# Patient Record
Sex: Female | Born: 2000 | Race: Black or African American | Hispanic: No | Marital: Single | Smoking: Current every day smoker
Health system: Southern US, Community
[De-identification: ages and names within clinical notes are randomized; demographics above are authoritative.]

## PROBLEM LIST (undated history)

## (undated) DIAGNOSIS — B9689 Other specified bacterial agents as the cause of diseases classified elsewhere: Secondary | ICD-10-CM

## (undated) DIAGNOSIS — A749 Chlamydial infection, unspecified: Secondary | ICD-10-CM

## (undated) DIAGNOSIS — R519 Headache, unspecified: Secondary | ICD-10-CM

## (undated) DIAGNOSIS — F419 Anxiety disorder, unspecified: Secondary | ICD-10-CM

## (undated) DIAGNOSIS — N760 Acute vaginitis: Secondary | ICD-10-CM

## (undated) DIAGNOSIS — R51 Headache: Secondary | ICD-10-CM

## (undated) DIAGNOSIS — N390 Urinary tract infection, site not specified: Secondary | ICD-10-CM

## (undated) HISTORY — DX: Urinary tract infection, site not specified: N39.0

## (undated) HISTORY — DX: Other specified bacterial agents as the cause of diseases classified elsewhere: N76.0

## (undated) HISTORY — PX: WISDOM TOOTH EXTRACTION: SHX21

## (undated) HISTORY — DX: Headache: R51

## (undated) HISTORY — DX: Acute vaginitis: B96.89

## (undated) HISTORY — DX: Headache, unspecified: R51.9

## (undated) HISTORY — DX: Anxiety disorder, unspecified: F41.9

## (undated) HISTORY — DX: Chlamydial infection, unspecified: A74.9

## (undated) HISTORY — PX: NO PAST SURGERIES: SHX2092

---
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
## 2018-01-16 ENCOUNTER — Ambulatory Visit (INDEPENDENT_AMBULATORY_CARE_PROVIDER_SITE_OTHER): Payer: 59 | Admitting: Obstetrics and Gynecology

## 2018-01-16 ENCOUNTER — Encounter: Payer: Self-pay | Admitting: Obstetrics and Gynecology

## 2018-01-16 ENCOUNTER — Other Ambulatory Visit (HOSPITAL_COMMUNITY)
Admission: RE | Admit: 2018-01-16 | Discharge: 2018-01-16 | Disposition: A | Discharge status: Home / Self Care | Payer: 59 | Source: Ambulatory Visit | Attending: Obstetrics and Gynecology | Admitting: Obstetrics and Gynecology

## 2018-01-16 VITALS — BP 98/60 | HR 84 | Ht 60.0 in | Wt 104.0 lb

## 2018-01-16 DIAGNOSIS — Z113 Encounter for screening for infections with a predominantly sexual mode of transmission: Secondary | ICD-10-CM

## 2018-01-16 DIAGNOSIS — N760 Acute vaginitis: Secondary | ICD-10-CM | POA: Diagnosis not present

## 2018-01-16 DIAGNOSIS — N921 Excessive and frequent menstruation with irregular cycle: Secondary | ICD-10-CM

## 2018-01-16 LAB — POCT WET PREP WITH KOH
Clue Cells Wet Prep HPF POC: NEGATIVE
KOH PREP POC: NEGATIVE
Trichomonas, UA: NEGATIVE
Yeast Wet Prep HPF POC: NEGATIVE

## 2018-01-16 MED ORDER — CLOTRIMAZOLE-BETAMETHASONE 1-0.05 % EX CREA: TOPICAL_CREAM | CUTANEOUS | 0 refills | Status: DC

## 2018-01-16 NOTE — Progress Notes (Signed)
[name], NP   Chief Complaint  Patient presents with  . STD check    pts feel outside of vaginal area irritated.    HPI:      [name] is a 17 y.o. G0P0000 who LMP was No LMP recorded. Patient has had an injection., presents today for ext vaginal irritation without increased d/c or odor for several days. Had yeast vag about a month ago and treated with diflucan with sx relief. No recent abx use. Pt using dove sens skin soap and dryer sheets. Would also like STD testing due to new sex partner. Using condoms sometimes. No known exposures, just wants to be safe. Neg STD testing 2/19 and 7/19. Hx of chlamydia in past.  Changed from depo to OCPs at 7/19 annual. Pt having BTB with pills, has missed a pill.   Past Medical History:  Diagnosis Date  . Chlamydia 2017   x2  . Frequent headaches   . UTI (lower urinary tract infection)     Past Surgical History:  Procedure Laterality Date  . NO PAST SURGERIES      Family History  Problem Relation Age of Onset  . Arthritis Maternal Grandmother   . Stroke Maternal Grandmother   . Hypertension Maternal Grandmother   . Diabetes Maternal Grandmother   . Hypercholesterolemia Maternal Grandmother   . Hypertension Maternal Grandfather   . Hypercholesterolemia Maternal Grandfather   . Other Mother        acid reflux    Social History   Socioeconomic History  . Marital status: Single    Spouse name: Not on file  . Number of children: 0  . Years of education: 37  . Highest education level: Not on file  Occupational History  . 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 Occupation: Consulting civil engineer    Comment: [employer]  Social Needs  . Financial resource strain: Not on file  . Food insecurity:    Worry: Not on file    Inability: Not on file  . Transportation needs:    Medical: Not on file    Non-medical: Not on file  Tobacco Use  . Smoking status: Never Smoker  . Smokeless tobacco: Never  Used  Substance and Sexual Activity  . Alcohol use: No    Alcohol/week: 0.0 standard drinks  . Drug use: No  . Sexual activity: Yes    Partners: Male    Birth control/protection: Pill  Lifestyle  . Physical activity:    Days per week: Not on file    Minutes per session: Not on file  . Stress: Not on file  Relationships  . Social connections:    Talks on phone: Not on file    Gets together: Not on file    Attends religious service: Not on file    Active member of club or organization: Not on file    Attends meetings of clubs or organizations: Not on file    Relationship status: Not on file  . Intimate partner violence:    Fear of current or ex partner: Not on file    Emotionally abused: Not on file    Physically abused: Not on file    Forced sexual activity: Not on file  Other Topics Concern  . Not on file  Social History Narrative   Student at [employer]   She would like to a metrologists.    Enjoys spending time with friends.   Will  be starting a job with [employer] in August 2019    Outpatient Medications Prior to Visit  Medication Sig Dispense Refill  . fluconazole (DIFLUCAN) 150 MG tablet Take one tablet today (Patient not taking: Reported on 01/16/2018) 1 tablet 0  . nystatin ointment (MYCOSTATIN) Apply 1 application topically 2 (two) times daily. 30 g 0   No facility-administered medications prior to visit.       ROS:  Review of Systems  Constitutional: Negative for fatigue, fever and unexpected weight change.  Respiratory: Negative for cough, shortness of breath and wheezing.   Cardiovascular: Negative for chest pain, palpitations and leg swelling.  Gastrointestinal: Negative for blood in stool, constipation, diarrhea, nausea and vomiting.  Endocrine: Negative for cold intolerance, heat intolerance and polyuria.  Genitourinary: Positive for menstrual problem. Negative for dyspareunia, dysuria, flank pain, frequency, genital sores, hematuria, pelvic pain, urgency, vaginal  bleeding, vaginal discharge and vaginal pain.  Musculoskeletal: Negative for back pain, joint swelling and myalgias.  Skin: Negative for rash.  Neurological: Negative for dizziness, syncope, light-headedness, numbness and headaches.  Hematological: Negative for adenopathy.  Psychiatric/Behavioral: Negative for agitation, confusion, sleep disturbance and suicidal ideas. The patient is not nervous/anxious.     OBJECTIVE:   Vitals:  BP 98/60   Pulse 84   Ht 5' (1.524 m)   Wt 104 lb (47.2 kg)   BMI 20.31 kg/m   Physical Exam  Constitutional: She is oriented to person, place, and time. Vital signs are normal. She appears well-developed.  Pulmonary/Chest: Effort normal.  Genitourinary: Vagina normal and uterus normal. There is no rash, tenderness or lesion on the right labia. There is no rash, tenderness or lesion on the left labia. Uterus is not enlarged and not tender. Cervix exhibits no motion tenderness. Right adnexum displays no mass and no tenderness. Left adnexum displays no mass and no tenderness. No erythema or tenderness in the vagina. No vaginal discharge found.  Musculoskeletal: Normal range of motion.  Neurological: She is alert and oriented to person, place, and time.  Psychiatric: She has a normal mood and affect. Her behavior is normal. Thought content normal.  Vitals reviewed.   Results: Results for orders placed or performed in visit on 01/16/18 (from the past 24 hour(s))  POCT Wet Prep with KOH     Status: Normal   Collection Time: 01/16/18  3:23 PM  Result Value Ref Range   Trichomonas, UA Negative    Clue Cells Wet Prep HPF POC neg    Epithelial Wet Prep HPF POC     Yeast Wet Prep HPF POC neg    Bacteria Wet Prep HPF POC     RBC Wet Prep HPF POC     WBC Wet Prep HPF POC     KOH Prep POC Negative Negative     Assessment/Plan: Acute vaginitis - Minimal exam/neg wet prep. Question chem vs fungal. Line dry underwear/no dryer sheets. Rx lotrisone crm. F/u prn.   - Plan: POCT Wet Prep with KOH, clotrimazole-betamethasone (LOTRISONE) cream  Screening for STD (sexually transmitted disease) - Will call with results.  - Plan: Cervicovaginal ancillary only  Breakthrough bleeding on OCPs - Give OCPs more time. Don't miss/be late with pills. F/u prn.    Meds ordered this encounter  Medications  . clotrimazole-betamethasone (LOTRISONE) cream    Sig: Apply externally BID prn sx up to 2 wks    Dispense:  15 g    Refill:  0    Order Specific Question:   Supervising Provider  Answer:   Nadara Mustard [[id number]]      Return if symptoms worsen or fail to improve.  Alicia B. Copland, PA-C 01/16/2018 3:25 PM

## 2018-01-16 NOTE — Patient Instructions (Signed)
I value your feedback and entrusting us with your care. If you get a [facility] patient survey, I would appreciate you taking the time to let us know about your experience today. Thank you! 

## 2018-01-18 LAB — CERVICOVAGINAL ANCILLARY ONLY
CHLAMYDIA, DNA PROBE: POSITIVE — AB
Neisseria Gonorrhea: NEGATIVE
TRICH (WINDOWPATH): NEGATIVE

## 2018-01-21 ENCOUNTER — Telehealth: Payer: Self-pay | Admitting: Obstetrics and Gynecology

## 2018-01-21 DIAGNOSIS — A749 Chlamydial infection, unspecified: Secondary | ICD-10-CM

## 2018-01-21 MED ORDER — AZITHROMYCIN 500 MG PO TABS
1000.0000 mg | ORAL_TABLET | Freq: Once | ORAL | 0 refills | Status: AC
Start: 2018-01-21 — End: 2018-01-21

## 2018-01-21 NOTE — Telephone Encounter (Signed)
LM with chlamydia results. Rx azithro. Partner needs tx. RTO in 4 wks for TOC. RN to notify [facility].

## 2018-01-22 NOTE — Telephone Encounter (Signed)
Done

## 2018-02-07 ENCOUNTER — Other Ambulatory Visit: Payer: Self-pay | Admitting: Certified Nurse Midwife

## 2018-02-07 MED ORDER — NORETHIN ACE-ETH ESTRAD-FE 1-20 MG-MCG(24) PO CAPS: 1.0000 | ORAL_CAPSULE | Freq: Every day | ORAL | 2 refills | Status: DC

## 2018-04-22 ENCOUNTER — Encounter: Payer: Self-pay | Admitting: Certified Nurse Midwife

## 2018-04-22 ENCOUNTER — Ambulatory Visit (INDEPENDENT_AMBULATORY_CARE_PROVIDER_SITE_OTHER): Payer: Managed Care, Other (non HMO) | Admitting: Certified Nurse Midwife

## 2018-04-22 ENCOUNTER — Other Ambulatory Visit (HOSPITAL_COMMUNITY)
Admission: RE | Admit: 2018-04-22 | Discharge: 2018-04-22 | Disposition: A | Discharge status: Home / Self Care | Payer: Managed Care, Other (non HMO) | Source: Ambulatory Visit | Attending: Certified Nurse Midwife | Admitting: Certified Nurse Midwife

## 2018-04-22 ENCOUNTER — Other Ambulatory Visit: Payer: Self-pay

## 2018-04-22 VITALS — BP 92/54 | HR 63 | Ht 60.0 in | Wt 98.0 lb

## 2018-04-22 DIAGNOSIS — B3731 Acute candidiasis of vulva and vagina: Secondary | ICD-10-CM

## 2018-04-22 DIAGNOSIS — Z113 Encounter for screening for infections with a predominantly sexual mode of transmission: Secondary | ICD-10-CM | POA: Diagnosis present

## 2018-04-22 DIAGNOSIS — L292 Pruritus vulvae: Secondary | ICD-10-CM

## 2018-04-22 DIAGNOSIS — B373 Candidiasis of vulva and vagina: Secondary | ICD-10-CM

## 2018-04-22 DIAGNOSIS — Z7251 High risk heterosexual behavior: Secondary | ICD-10-CM

## 2018-04-22 DIAGNOSIS — Z3009 Encounter for other general counseling and advice on contraception: Secondary | ICD-10-CM

## 2018-04-22 LAB — POCT WET PREP (WET MOUNT): Trichomonas Wet Prep HPF POC: ABSENT

## 2018-04-22 MED ORDER — FLUCONAZOLE 150 MG PO TABS: ORAL_TABLET | ORAL | 0 refills | Status: DC

## 2018-04-22 MED ORDER — ETONOGESTREL-ETHINYL ESTRADIOL 0.12-0.015 MG/24HR VA RING: VAGINAL_RING | VAGINAL | 6 refills | Status: DC

## 2018-04-22 NOTE — Progress Notes (Signed)
Obstetrics & Gynecology Office Visit   Chief Complaint:  Chief Complaint  Patient presents with  . Gynecologic Exam    STD check, No known exposure    History of Present Illness: 18 year old BF, LMP 22 December, presents for STD testing. Has some vulvar itching She has a new partner and has been having unprotected intercourse. Had Chlamydia in October 2019 (treated for Chlamydia twice before that). Stopped taking OCPs in November because was missing pills and having BTB. Partner did not use condoms since she stopped taking the pills. Does not want to be pregnant.  Had a negative pregnancy test  Had a day of spotting last week and had a negative UPT at home.   Review of Systems:  ROS   Past Medical History:  Past Medical History:  Diagnosis Date  . Chlamydia 2017   x3  . Frequent headaches   . UTI (lower urinary tract infection)     Past Surgical History:  Past Surgical History:  Procedure Laterality Date  . NO PAST SURGERIES      Gynecologic History: Patient's last menstrual period was 03/31/2018.  Obstetric History: G0P0000  Family History:  Family History  Problem Relation Age of Onset  . Arthritis Maternal Grandmother   . Stroke Maternal Grandmother   . Hypertension Maternal Grandmother   . Diabetes Maternal Grandmother   . Hypercholesterolemia Maternal Grandmother   . Hypertension Maternal Grandfather   . Hypercholesterolemia Maternal Grandfather   . Other Mother        acid reflux    Social History:  Social History   Socioeconomic History  . Marital status: Single    Spouse name: Not on file  . Number of children: 0  . Years of education: 68  . Highest education level: Not on file  Occupational History  . 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 Occupation: Consulting civil engineer    Comment: [employer]  Social Needs  . Financial resource strain: Not on file  . Food insecurity:    Worry: Not on file    Inability: Not on file  .  Transportation needs:    Medical: Not on file    Non-medical: Not on file  Tobacco Use  . Smoking status: Never Smoker  . Smokeless tobacco: Never Used  Substance and Sexual Activity  . Alcohol use: No    Alcohol/week: 0.0 standard drinks  . Drug use: No  . Sexual activity: Yes    Partners: Male    Birth control/protection: Pill  Lifestyle  . Physical activity:    Days per week: Not on file    Minutes per session: Not on file  . Stress: Not on file  Relationships  . Social connections:    Talks on phone: Not on file    Gets together: Not on file    Attends religious service: Not on file    Active member of club or organization: Not on file    Attends meetings of clubs or organizations: Not on file    Relationship status: Not on file  . Intimate partner violence:    Fear of current or ex partner: Not on file    Emotionally abused: Not on file    Physically abused: Not on file    Forced sexual activity: Not on file  Other Topics Concern  . Not on file  Social History Narrative   Student at [employer]   She would like to a metrologists.  Enjoys spending time with friends.   Will be starting a job with [employer] in August 2019    Allergies:  Allergies  Allergen Reactions  . Penicillins Hives    Medications: none Physical Exam: Vital signs: BP (!) 92/54 (BP Location: Right Arm, Patient Position: Sitting, Cuff Size: Normal)   Pulse 63   Ht 5' (1.524 m)   Wt 98 lb (44.5 kg)   LMP 03/31/2018   SpO2 99%   BMI 19.14 kg/m  General: petite BF in NAD  External/BUS: mild inflammation at introitus, no lesions Vagina: white discharge Cervix: no lesions  Results for orders placed or performed in visit on 04/22/18 (from the past 24 hour(s))  POCT Wet Prep Mellody [place])     Status: Abnormal   Collection Time: 04/22/18  9:51 PM  Result Value Ref Range   Source Wet Prep POC vagina    WBC, Wet Prep HPF POC     Bacteria Wet Prep HPF POC     BACTERIA WET PREP MORPHOLOGY POC     Clue  Cells Wet Prep HPF POC None None   Clue Cells Wet Prep Whiff POC     Yeast Wet Prep HPF POC Many (A) None   KOH Wet Prep POC     Trichomonas Wet Prep HPF POC Absent Absent    Assessment: 18 y.o. G0P0000 with monilial vulvovaginitis Screening for STDs Unprotected intercourse:R/O pregnancy  Plan: Diflucan 150 mgm x1 Can use Lotrisone cream externally if needed that she has at home RPR, HIV, beta HCG drawn Aptima done Discussed use of contraception if does not desire pregnancy. Discussed other methods of contraception like the NUvaring or the patch, which she does not have to remember to take daily. If she is not pregnant, desires to try the Nuvaring. Discussed how to use, 3 hour rule. Needs also to use condoms to prevent STDs. Will call with results of labs.  Farrel Conners, CNM

## 2018-04-23 ENCOUNTER — Other Ambulatory Visit: Payer: Self-pay | Admitting: Certified Nurse Midwife

## 2018-04-23 LAB — BETA HCG QUANT (REF LAB)

## 2018-04-23 LAB — HIV ANTIBODY (ROUTINE TESTING W REFLEX): HIV Screen 4th Generation wRfx: NONREACTIVE

## 2018-04-23 LAB — CERVICOVAGINAL ANCILLARY ONLY
CHLAMYDIA, DNA PROBE: POSITIVE — AB
NEISSERIA GONORRHEA: NEGATIVE
TRICH (WINDOWPATH): NEGATIVE

## 2018-04-23 LAB — RPR QUALITATIVE: RPR: NONREACTIVE

## 2018-04-23 MED ORDER — AZITHROMYCIN 500 MG PO TABS: ORAL_TABLET | ORAL | 0 refills | Status: DC

## 2018-06-20 ENCOUNTER — Ambulatory Visit: Payer: Managed Care, Other (non HMO) | Admitting: Family Medicine

## 2018-06-20 ENCOUNTER — Telehealth: Payer: Self-pay

## 2018-06-20 NOTE — Telephone Encounter (Signed)
Pt said for 2 days pt has runny nose, non prod cough,H/A, feels weak,SOB on and off, last night was last episode of SOB.head congestion.temp last night was 99.pt is concerned about corona virus; pt has not traveled anywhere and has not had contact with positive tested corona virus pt. Last wk pts cousin had similar symptoms but was not confirmed to have flu. Allayne Gitelman NP advised could schedule appt with provider this afternoon; does not meet corona virus criteria. Pt scheduled appt with Dr Patsy Lager 06/20/18 at 2:20 PM. FYI to Dr Patsy Lager.

## 2018-07-05 ENCOUNTER — Other Ambulatory Visit: Payer: Self-pay | Admitting: Certified Nurse Midwife

## 2018-08-27 ENCOUNTER — Other Ambulatory Visit: Payer: Self-pay | Admitting: Obstetrics and Gynecology

## 2018-08-27 ENCOUNTER — Encounter: Payer: Self-pay | Admitting: Obstetrics and Gynecology

## 2018-08-27 MED ORDER — FLUCONAZOLE 150 MG PO TABS: ORAL_TABLET | ORAL | 0 refills | Status: DC

## 2018-08-27 NOTE — Progress Notes (Signed)
Rx diflucan for yeast vag sx. RTO if sx persist. 

## 2018-08-27 NOTE — Telephone Encounter (Signed)
Can you send in rx for pt since CLG is not in the office ?

## 2018-10-14 NOTE — Telephone Encounter (Signed)
Please schedule

## 2018-10-14 NOTE — Telephone Encounter (Signed)
Patient is schedule 11/08/18 with Jaclyn Shaggy

## 2018-10-23 ENCOUNTER — Telehealth: Payer: Self-pay

## 2018-10-23 NOTE — Telephone Encounter (Signed)
Pt has had SOB on and off daily for 1 wk; pt has SOB if sitting.Pt does have SOB today but is speaking in complete sentences and pt does not seem in distress with breathing. Pt had CP that was sharp  X 1 last wk.No CP now, H/A, dizziness,no fever, chills, cough S/T, muscle pain,diarrhea or loss of taste or smell. No travel and no known exposure to covid. Pt has no hx of asthma.  Pt has been feeling anxious as well. Pt has in office appt on 10/24/18 at 11:20. ED precautions given.

## 2018-10-23 NOTE — Telephone Encounter (Signed)
Noted, will evaluate. 

## 2018-10-24 ENCOUNTER — Encounter: Payer: Self-pay | Admitting: Primary Care

## 2018-10-24 ENCOUNTER — Ambulatory Visit (INDEPENDENT_AMBULATORY_CARE_PROVIDER_SITE_OTHER): Payer: Managed Care, Other (non HMO) | Admitting: Primary Care

## 2018-10-24 ENCOUNTER — Other Ambulatory Visit: Payer: Self-pay

## 2018-10-24 VITALS — BP 120/80 | HR 93 | Temp 98.0°F | Ht 60.0 in | Wt 92.8 lb

## 2018-10-24 DIAGNOSIS — R0602 Shortness of breath: Secondary | ICD-10-CM | POA: Insufficient documentation

## 2018-10-24 DIAGNOSIS — F411 Generalized anxiety disorder: Secondary | ICD-10-CM | POA: Insufficient documentation

## 2018-10-24 LAB — BASIC METABOLIC PANEL
BUN: 7 mg/dL (ref 6–23)
CO2: 28 mEq/L (ref 19–32)
Calcium: 9.4 mg/dL (ref 8.4–10.5)
Chloride: 103 mEq/L (ref 96–112)
Creatinine, Ser: 0.67 mg/dL (ref 0.40–1.20)
GFR: 135.66 mL/min (ref >60.00)
Glucose, Bld: 86 mg/dL (ref 70–99)
Potassium: 4.2 mEq/L (ref 3.5–5.1)
Sodium: 138 mEq/L (ref 135–145)

## 2018-10-24 LAB — CBC
HCT: 38.7 % (ref 36.0–46.0)
Hemoglobin: 12.5 g/dL (ref 12.0–15.0)
MCHC: 32.2 g/dL (ref 30.0–36.0)
MCV: 88.8 fl (ref 78.0–100.0)
Platelets: 245 10*3/uL (ref 150.0–400.0)
RBC: 4.36 Mil/uL (ref 3.87–5.11)
RDW: 14.2 % (ref 11.5–14.6)
WBC: 4.7 10*3/uL (ref 4.5–10.5)

## 2018-10-24 LAB — TSH: TSH: 1.18 u[IU]/mL (ref 0.35–5.50)

## 2018-10-24 NOTE — Assessment & Plan Note (Signed)
Acute for the last two weeks. Not on birth control, not smoking cigarettes. Exam today unremarkable.  Suspect more anxiety to be cause but will rule out metabolic cause as well.   Labs pending for TSH, CBC, BMP, D-dimer. Referral placed to therapy for chronic anxiety.

## 2018-10-24 NOTE — Assessment & Plan Note (Signed)
Mild, chronic anxiety for years. Increased anxiety since March 2020. GAD 7 score of 15 today.   Suspect symptoms of SOB secondary to anxiety, ruling out metabolic cause. Referral placed to therapy.

## 2018-10-24 NOTE — Patient Instructions (Signed)
Stop by the lab prior to leaving today. I will notify you of your results once received.   Work on reducing stress and anxiety symptoms as discussed.   It was a pleasure to see you today!

## 2018-10-24 NOTE — Progress Notes (Signed)
Subjective:    Patient ID: Krista BeamsAlanna J Flores, female    DOB: 09-12-2000, 18 y.o.   MRN: [id number]  HPI  Krista Flores is a 18 year old female with a history of chest pain, back pain, anxiety who presents today with a chief complaint of shortness of breath.   Shortness of breath began two weeks ago which is intermittent. Shortness of breath will occur at rest and exertion and notices when she's feeling stressed/anxious. She has a history of chronic anxiety for years that has been mild and overall manageable.   She denies cough, congestion, wheezing, fevers, chest pain. She has been under a lot of stress and feeling more anxious since Covid-19 began.   She is no longer managed on Nuva Ring for pregnancy prevention. She does smoke marijuana for anxiety, no cigarette use.  Review of Systems  Constitutional: Negative for fever.  HENT: Negative for congestion.   Respiratory: Positive for shortness of breath. Negative for cough.   Cardiovascular: Negative for chest pain.  Psychiatric/Behavioral: The patient is nervous/anxious.        Past Medical History:  Diagnosis Date  . Chlamydia 2017   x3  . Frequent headaches   . UTI (lower urinary tract infection)      Social History   Socioeconomic History  . Marital status: Single    Spouse name: Not on file  . Number of children: 0  . Years of education: 8712  . Highest education level: Not on file  Occupational History  . Occupation: Conservation officer, [employer]    CommentHigher education careers adviser: [place] [employer] [place]  . Occupation: Consulting civil engineerstudent    Comment: [employer]  Social Needs  . Financial resource strain: Not on file  . Food insecurity    Worry: Not on file    Inability: Not on file  . Transportation needs    Medical: Not on file    Non-medical: Not on file  Tobacco Use  . Smoking status: Never Smoker  . Smokeless tobacco: Never Used  Substance and Sexual Activity  . Alcohol use: No    Alcohol/week: 0.0 standard drinks  . Drug use: No  . Sexual activity: Yes     Partners: Male    Birth control/protection: Pill  Lifestyle  . Physical activity    Days per week: Not on file    Minutes per session: Not on file  . Stress: Not on file  Relationships  . Social Musicianconnections    Talks on phone: Not on file    Gets together: Not on file    Attends religious service: Not on file    Active member of club or organization: Not on file    Attends meetings of clubs or organizations: Not on file    Relationship status: Not on file  . Intimate partner violence    Fear of current or ex partner: Not on file    Emotionally abused: Not on file    Physically abused: Not on file    Forced sexual activity: Not on file  Other Topics Concern  . Not on file  Social History Narrative   Student at [employer]   She would like to a metrologists.    Enjoys spending time with friends.   Will be starting a job with [employer] in August 2019    Past Surgical History:  Procedure Laterality Date  . NO PAST SURGERIES      Family History  Problem Relation Age of Onset  . Arthritis Maternal Grandmother   .  Stroke Maternal Grandmother   . Hypertension Maternal Grandmother   . Diabetes Maternal Grandmother   . Hypercholesterolemia Maternal Grandmother   . Hypertension Maternal Grandfather   . Hypercholesterolemia Maternal Grandfather   . Other Mother        acid reflux    Allergies  Allergen Reactions  . Penicillins Hives    Current Outpatient Medications on File Prior to Visit  Medication Sig Dispense Refill  . clotrimazole-betamethasone (LOTRISONE) cream Apply externally BID prn sx up to 2 wks (Patient not taking: Reported on 04/22/2018) 15 g 0   No current facility-administered medications on file prior to visit.     BP 120/80   Pulse 93   Temp 98 F (36.7 C) (Temporal)   Ht 5' (1.524 m)   Wt 92 lb 12 oz (42.1 kg)   SpO2 100%   BMI 18.11 kg/m    Objective:   Physical Exam  Constitutional: She appears well-nourished.  Neck: Neck supple.  Cardiovascular:  Normal rate and regular rhythm.  Respiratory: Effort normal and breath sounds normal.  Skin: Skin is warm and dry.  Psychiatric: She has a normal mood and affect.           Assessment & Plan:

## 2018-10-25 LAB — D-DIMER, QUANTITATIVE: D-Dimer, Quant: 0.37 mcg/mL FEU (ref <0.50)

## 2018-11-08 ENCOUNTER — Other Ambulatory Visit (HOSPITAL_COMMUNITY)
Admission: RE | Admit: 2018-11-08 | Discharge: 2018-11-08 | Disposition: A | Discharge status: Home / Self Care | Payer: Managed Care, Other (non HMO) | Source: Ambulatory Visit | Attending: Certified Nurse Midwife | Admitting: Certified Nurse Midwife

## 2018-11-08 ENCOUNTER — Ambulatory Visit (INDEPENDENT_AMBULATORY_CARE_PROVIDER_SITE_OTHER): Payer: Managed Care, Other (non HMO) | Admitting: Certified Nurse Midwife

## 2018-11-08 ENCOUNTER — Other Ambulatory Visit: Payer: Self-pay

## 2018-11-08 ENCOUNTER — Encounter: Payer: Self-pay | Admitting: Certified Nurse Midwife

## 2018-11-08 VITALS — BP 90/60 | Ht 60.0 in | Wt 92.6 lb

## 2018-11-08 DIAGNOSIS — Z113 Encounter for screening for infections with a predominantly sexual mode of transmission: Secondary | ICD-10-CM

## 2018-11-08 DIAGNOSIS — Z8619 Personal history of other infectious and parasitic diseases: Secondary | ICD-10-CM

## 2018-11-08 DIAGNOSIS — Z01419 Encounter for gynecological examination (general) (routine) without abnormal findings: Secondary | ICD-10-CM

## 2018-11-08 NOTE — Progress Notes (Signed)
Gynecology Annual Exam  PCP: Doreene Nestlark, Katherine K, NP  Chief Complaint:  Chief Complaint  Patient presents with  . Gynecologic Exam    History of Present Illness: Krista Flores is a 18 y.o. BF G0P0000 who presents for her annual gyn exam. Her menses have become more regular since her last visit in January. They are now monthly and her LMP was 10/25/2018. They last 2-7 days and her first few days are heavier requiring tampon change every 2 hours. She reports headaches and cramping in the first few days of her menses. Goody's powder helps relieve her headache,but not her cramps. Sometime drinks her grandmother's thyme tea with relief.  She is not currently sexually active and is not currently using birth control. Has used Depo provera, pills and more recently tried the Nuvaring, She broke up with her boyfriend after being treated for Chlamydia in January (fourth positive NAA in 2 years) She has not had a PAP in the past due to age. Past medical history is remarkable for anxiety   The patient does not perform self breast exams.  There is no notable family history of breast or ovarian cancer in her family.  The patient has regular exercise:just started back exercising.  Current BMI is 18.08  kg/m2 She does not smoke. Denies alcohol intake. She does not use illicit street drugs. She is lactose intolerant and does not get adequate calcium in her diet.  The patient denies current symptoms of depression.    Review of Systems: Review of Systems  Constitutional: Negative for chills, fever and weight loss.  HENT: Negative for congestion, sinus pain and sore throat.   Eyes: Negative for blurred vision, pain and redness.  Respiratory: Negative for hemoptysis, shortness of breath and wheezing.   Cardiovascular: Negative for chest pain, palpitations and leg swelling.  Gastrointestinal: Negative for abdominal pain, blood in stool, diarrhea, heartburn, nausea and vomiting.  Genitourinary: Negative for  dysuria, frequency, hematuria and urgency.       Positive for dysmenorrhea  Musculoskeletal: Negative for back pain, joint pain and myalgias.  Skin: Negative for itching and rash.  Neurological: Positive for headaches. Negative for dizziness and tingling.  Endo/Heme/Allergies: Negative for environmental allergies and polydipsia. Does not bruise/bleed easily.       Negative for hirsutism   Psychiatric/Behavioral: Negative for depression. The patient is not nervous/anxious and does not have insomnia.     Past Medical History:  Past Medical History:  Diagnosis Date  . Anxiety   . Chlamydia 2017-2020   x4  . Chlamydia infection 08/15/2016   Positive Chlamydia on 08/08/2016 and 10/19/2016  . Frequent headaches   . UTI (lower urinary tract infection)     Past Surgical History:  Past Surgical History:  Procedure Laterality Date  . NO PAST SURGERIES       Family History:  Family History  Problem Relation Age of Onset  . Arthritis Maternal Grandmother   . Stroke Maternal Grandmother   . Hypertension Maternal Grandmother   . Diabetes Maternal Grandmother   . Hypercholesterolemia Maternal Grandmother   . Hypertension Maternal Grandfather   . Hypercholesterolemia Maternal Grandfather   . Other Mother        acid reflux    Social History:  Social History   Socioeconomic History  . Marital status: Single    Spouse name: Not on file  . Number of children: 0  . Years of education: 6412  . Highest education level: Not on file  Occupational  History  . 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 Occupation: Ship broker    Comment: [place]  . Financial resource strain: Not on file  . Food insecurity    Worry: Not on file    Inability: Not on file  . Transportation needs    Medical: Not on file    Non-medical: Not on file  Tobacco Use  . Smoking status: Never Smoker  . Smokeless tobacco: Never Used  Substance and Sexual Activity  . Alcohol  use: No    Alcohol/week: 0.0 standard drinks  . Drug use: No  . Sexual activity: Not Currently    Partners: Male    Birth control/protection: None  Lifestyle  . Physical activity    Days per week: Not on file    Minutes per session: Not on file  . Stress: Not on file  Relationships  . Social Herbalist on phone: Not on file    Gets together: Not on file    Attends religious service: Not on file    Active member of club or organization: Not on file    Attends meetings of clubs or organizations: Not on file    Relationship status: Not on file  . Intimate partner violence    Fear of current or ex partner: Not on file    Emotionally abused: Not on file    Physically abused: Not on file    Forced sexual activity: Not on file  Other Topics Concern  . Not on file  Social History Narrative   Student at [employer]   She would like to a metrologists.    Enjoys spending time with friends.   Will be starting a job with [employer] in August 2019    Allergies:  Allergies  Allergen Reactions  . Penicillins Hives    Medications: none Physical Exam Vitals: BP 90/60   Ht 5' (1.524 m)   Wt 92 lb 9.6 oz (42 kg)   LMP 10/25/2018 (Approximate)   BMI 18.08 kg/m .  General: petite BF in NAD HEENT: normocephalic, anicteric Thyroid: no enlargement, no palpable nodules Pulmonary: No increased work of breathing, CTAB Cardiovascular: RRR without murmur Breast: Breast symmetrical in size, no tenderness,  no skin or nipple retraction present, no nipple discharge. She has pierced nipples.  There is increased density in the upper part of both breasts. No axillary, infraclavicular,  or supraclavicular lymphadenopathy. Abdomen: soft, non-tender, non-distended.  Umbilicus is pierced.  No hepatomegaly or masses palpable. No evidence of hernia  Genitourinary:  External/ BUS: Normal urethral meatus, normal Bartholin's and Skene's glands.    Vagina: no lesions    Cervix: closed, mobile, NT  Uterus:  AF, NSSC, mobile, NT  Adnexa: no adnexal masses, NT  Rectal: deferred  Lymphatic: no evidence of inguinal lymphadenopathy Extremities: no edema, erythema, or tenderness Neurologic: Grossly intact Psychiatric: mood appropriate, affect full    Assessment: 18 y.o. G0P0000 well woman exam STD screening/ TOC for Chlamydia   Plan:  1) Contraception: abstinence for now  2) STI screening was offered and accepted. Will send GC/Chlamydia NAAT today.   3) Discussed trying Aleve to see if it will decrease bleeding and cramping and headache  4) Discussed taking vitamin D and calcium supplements  5) RTO 1 year and prn  Dalia Heading, CNM

## 2018-11-09 ENCOUNTER — Encounter: Payer: Self-pay | Admitting: Certified Nurse Midwife

## 2018-11-09 DIAGNOSIS — Z8619 Personal history of other infectious and parasitic diseases: Secondary | ICD-10-CM | POA: Insufficient documentation

## 2018-11-12 LAB — CERVICOVAGINAL ANCILLARY ONLY
Chlamydia: NEGATIVE
Neisseria Gonorrhea: NEGATIVE

## 2018-11-14 ENCOUNTER — Ambulatory Visit: Payer: 59 | Admitting: Psychology

## 2019-01-02 ENCOUNTER — Encounter: Payer: Self-pay | Admitting: Internal Medicine

## 2019-01-02 ENCOUNTER — Other Ambulatory Visit: Payer: Self-pay

## 2019-01-02 ENCOUNTER — Ambulatory Visit (INDEPENDENT_AMBULATORY_CARE_PROVIDER_SITE_OTHER): Payer: Managed Care, Other (non HMO) | Admitting: Internal Medicine

## 2019-01-02 ENCOUNTER — Telehealth: Payer: Self-pay

## 2019-01-02 ENCOUNTER — Ambulatory Visit: Payer: Managed Care, Other (non HMO) | Admitting: Family Medicine

## 2019-01-02 VITALS — BP 106/70 | HR 134 | Temp 101.0°F | Wt 95.0 lb

## 2019-01-02 DIAGNOSIS — N898 Other specified noninflammatory disorders of vagina: Secondary | ICD-10-CM | POA: Diagnosis not present

## 2019-01-02 DIAGNOSIS — M545 Low back pain, unspecified: Secondary | ICD-10-CM

## 2019-01-02 DIAGNOSIS — R509 Fever, unspecified: Secondary | ICD-10-CM

## 2019-01-02 DIAGNOSIS — R000 Tachycardia, unspecified: Secondary | ICD-10-CM | POA: Diagnosis not present

## 2019-01-02 DIAGNOSIS — Z20822 Contact with and (suspected) exposure to covid-19: Secondary | ICD-10-CM

## 2019-01-02 LAB — POC URINALSYSI DIPSTICK (AUTOMATED)
Bilirubin, UA: NEGATIVE
Blood, UA: NEGATIVE
Glucose, UA: NEGATIVE
Ketones, UA: NEGATIVE
Leukocytes, UA: NEGATIVE
Nitrite, UA: NEGATIVE
Protein, UA: NEGATIVE
Spec Grav, UA: 1.015 (ref 1.010–1.025)
Urobilinogen, UA: 0.2 E.U./dL
pH, UA: 7 (ref 5.0–8.0)

## 2019-01-02 MED ORDER — CEFTRIAXONE SODIUM 250 MG IJ SOLR
250.0000 mg | Freq: Once | INTRAMUSCULAR | Status: AC
  Administered 2019-01-02: 250 mg via INTRAMUSCULAR

## 2019-01-02 MED ORDER — DOXYCYCLINE HYCLATE 100 MG PO TABS: 100.0000 mg | ORAL_TABLET | Freq: Two times a day (BID) | ORAL | 0 refills | Status: DC

## 2019-01-02 NOTE — Patient Instructions (Signed)
Pelvic Inflammatory Disease  Pelvic inflammatory disease (PID) is an infection in some or all of the female reproductive organs. PID can be in the womb (uterus), ovaries, fallopian tubes, or nearby tissues that are inside the lower belly area (pelvis). PID can lead to problems if it is not treated. What are the causes?  Germs (bacteria) that are spread during sex. This is the most common cause.  Germs in the vagina that are not spread during sex.  Germs that travel up from the vagina or cervix to the reproductive organs after: ? The birth of a baby. ? A miscarriage. ? An abortion. ? Pelvic surgery. ? Insertion of an intrauterine device (IUD). ? A sexual assault. What increases the risk?  Being younger than 20 years old.  Having sex at a young age.  Having a history of STI (sexually transmitted infection) or PID.  Not using barrier birth control, such as condoms.  Having a lot of sex partners.  Having sex with someone who has symptoms of an STI.  Using a douche.  Having an IUD put in place. What are the signs or symptoms?  Pain in the belly area.  Fever.  Chills.  Discharge from the vagina that is not normal.  Bleeding from the womb that is not normal.  Pain soon after the end of a menstrual period.  Pain when you pee (urinate).  Pain with sex.  Feeling sick to your stomach (nauseous) or throwing up (vomiting). How is this treated?  Antibiotic medicines. In very bad cases, these may be given through an IV tube.  Surgery. This is rare.  Efforts to stop the spread of the infection. Sex partners may need to be treated. It may take weeks until you feel all better. Your doctor may test you for infection again after you finish treatment. You should also be checked for HIV (human immunodeficiency virus). Follow these instructions at home:  Take over-the-counter and prescription medicines only as told by your doctor.  If you were prescribed an antibiotic  medicine, take it as told by your doctor. Do not stop taking it even if you start to feel better.  Do not have sex until treatment is done or as told by your doctor.  Tell your sex partner if you have PID. Your partner may need to be treated.  Keep all follow-up visits as told by your doctor. This is important. Contact a doctor if:  You have more fluid or fluid that is not normal coming from your vagina.  Your pain does not improve.  You throw up.  You have a fever.  You cannot take your medicines.  Your partner has an STI.  You have pain when you pee. Get help right away if:  You have more pain in the belly area.  You have chills.  You are not better in 72 hours with treatment. Summary  Pelvic inflammatory disease (PID) is caused by an infection in some or all of the female reproductive organs.  PID is a serious infection.  This infection is most often treated with antibiotics.  Do not have sex until treatment is done or as told by your doctor. This information is not intended to replace advice given to you by your health care provider. Make sure you discuss any questions you have with your health care provider. Document Released: 06/23/2008 Document Revised: 12/13/2017 Document Reviewed: 12/19/2017 Elsevier Patient Education  2020 Elsevier Inc.  

## 2019-01-02 NOTE — Telephone Encounter (Signed)
On 01/01/19 pt started with rt lower back pain, cottage cheese like vaginal discharge which was odorless with vaginal and perineal itching. This morning pt said had bright red blood with clots when urinated. No frequency of urine and pt is sure blood was not vaginal. Last menstrual cycle was 1 1/2 wks ago. Pt has no covid symptoms, no travel and no known exposure to + covid. Pt is presently in [place] to pick her sister up and take her sister home. Pt could not be at [facility] before 12:30. Pt scheduled in office appt today at 2 pm with Dr Lorelei Pont. Pt to come to office 1:45 to ck in. FYI to Dr Lorelei Pont.

## 2019-01-02 NOTE — Telephone Encounter (Signed)
Mrs. Krista Flores will see the patient

## 2019-01-02 NOTE — Progress Notes (Signed)
Subjective:    Patient ID: Krista BeamsAlanna J Flores, female    DOB: 09-Aug-2000, 18 y.o.   MRN: [id number]  HPI  Pt presents to the clinic today with c/o vaginal discharge, itching and right lower back pain. She reports this started 2 days ago. She describes the discharge as thick and white. She denies pelvic pain or painful intercourse. She noticed blood in her urine this morning. She does not think it is her menstrual cycle because she had that on 9/12. She denies nausea, vomiting but has had fever up to 101F. She is sexually active and has been having unprotected intercourse. She has tried topical [place] with minimal relief.  Review of Systems  Past Medical History:  Diagnosis Date  . Anxiety   . Chlamydia 2017-2020   x4  . Chlamydia infection 08/15/2016   Positive Chlamydia on 08/08/2016 and 10/19/2016  . Frequent headaches   . UTI (lower urinary tract infection)     No current outpatient medications on file.   No current facility-administered medications for this visit.     Allergies  Allergen Reactions  . Penicillins Hives    Family History  Problem Relation Age of Onset  . Arthritis Maternal Grandmother   . Stroke Maternal Grandmother   . Hypertension Maternal Grandmother   . Diabetes Maternal Grandmother   . Hypercholesterolemia Maternal Grandmother   . Hypertension Maternal Grandfather   . Hypercholesterolemia Maternal Grandfather   . Other Mother        acid reflux    Social History   Socioeconomic History  . Marital status: Single    Spouse name: Not on file  . Number of children: 0  . Years of education: 5912  . Highest education level: Not on file  Occupational History  . Occupation: Conservation officer, [employer]ash[employer]    CommentHigher education careers adviser: [employer]  . Occupation: Consulting civil engineerstudent    Comment: [employer]  Social Needs  . Financial resource strain: Not on file  . Food insecurity    Worry: Not on file    Inability: Not on file  . Transportation needs    Medical: Not on file   Non-medical: Not on file  Tobacco Use  . Smoking status: Never Smoker  . Smokeless tobacco: Never Used  Substance and Sexual Activity  . Alcohol use: No    Alcohol/week: 0.0 standard drinks  . Drug use: No  . Sexual activity: Not Currently    Partners: Male    Birth control/protection: None  Lifestyle  . Physical activity    Days per week: Not on file    Minutes per session: Not on file  . Stress: Not on file  Relationships  . Social Musicianconnections    Talks on phone: Not on file    Gets together: Not on file    Attends religious service: Not on file    Active member of club or organization: Not on file    Attends meetings of clubs or organizations: Not on file    Relationship status: Not on file  . Intimate partner violence    Fear of current or ex partner: Not on file    Emotionally abused: Not on file    Physically abused: Not on file    Forced sexual activity: Not on file  Other Topics Concern  . Not on file  Social History Narrative   Student at [employer]   She would like to a metrologists.    Enjoys spending time with friends.   Will be  starting a job with [employer] in August 2019     Constitutional: Pt reports fever. Denies malaise, fatigue, headache or abrupt weight changes.  Respiratory: Denies difficulty breathing, shortness of breath, cough or sputum production.   Cardiovascular: Denies chest pain, chest tightness, palpitations or swelling in the hands or feet.  Gastrointestinal: Denies abdominal pain, bloating, constipation, diarrhea or blood in the stool.  GU: Pt reports vaginal discharge, itching. Denies urgency, frequency, pain with urination, burning sensation, blood in urine, odor. Musculoskeletal: Pt reports right lower back pain. Denies decrease in range of motion, difficulty with gait,  or joint pain and swelling.    No other specific complaints in a complete review of systems (except as listed in HPI above).     Objective:   Physical Exam   BP 106/70    Pulse (!) 134   Temp (!) 101 F (38.3 C) (Oral)   Wt 95 lb (43.1 kg)   LMP 12/21/2018   SpO2 98%   BMI 18.55 kg/m  Wt Readings from Last 3 Encounters:  01/02/19 95 lb (43.1 kg)  11/08/18 92 lb 9.6 oz (42 kg)  10/24/18 92 lb 12 oz (42.1 kg)    General: Appears her stated age, underweight, in NAD. Skin: Warm, dry and intact. No vaginal rashes, lesions or ulcerations noted. Cardiovascular: Tachycardic with normal rhythm.  Pulmonary/Chest: Normal effort and positive vesicular breath sounds. No respiratory distress. No wheezes, rales or ronchi noted.  Abdomen: Soft and nontender. Normal bowel sounds. No distention or masses noted. No CVA tenderness noted.  Neurological: Alert and oriented.   BMET    Component Value Date/Time   NA 138 10/24/2018 1137   NA 142 (H) 02/28/2014 1804   K 4.2 10/24/2018 1137   K 3.8 02/28/2014 1804   CL 103 10/24/2018 1137   CL 106 02/28/2014 1804   CO2 28 10/24/2018 1137   CO2 31 (H) 02/28/2014 1804   GLUCOSE 86 10/24/2018 1137   GLUCOSE 81 02/28/2014 1804   BUN 7 10/24/2018 1137   BUN 9 02/28/2014 1804   CREATININE 0.67 10/24/2018 1137   CREATININE 0.81 02/28/2014 1804   CALCIUM 9.4 10/24/2018 1137   CALCIUM 8.8 (L) 02/28/2014 1804    Lipid Panel  No results found for: CHOL, TRIG, HDL, CHOLHDL, VLDL, LDLCALC  CBC    Component Value Date/Time   WBC 4.7 10/24/2018 1137   RBC 4.36 10/24/2018 1137   HGB 12.5 10/24/2018 1137   HGB 12.8 02/28/2014 1804   HCT 38.7 10/24/2018 1137   HCT 39.0 02/28/2014 1804   PLT 245.0 10/24/2018 1137   PLT 262 02/28/2014 1804   MCV 88.8 10/24/2018 1137   MCV 89 02/28/2014 1804   MCH 29.1 02/28/2014 1804   MCHC 32.2 10/24/2018 1137   RDW 14.2 10/24/2018 1137   RDW 13.4 02/28/2014 1804   LYMPHSABS 3.2 02/28/2014 1804   MONOABS 0.6 02/28/2014 1804   EOSABS 0.2 02/28/2014 1804   BASOSABS 0.1 02/28/2014 1804    Hgb A1C No results found for: HGBA1C         Assessment & Plan:   Vaginal  Discharge, Vaginal Itching, Right Lower Back Pain, Fever:  Urinalysis: normal Will send urine culture Wet prep to check for trich, yeast or BV Will obtain urine gonorrhea and chlamydia Low suspicion for kidney stone or UTI More concerned about PID Rocephin 250 mg IM x 1 Doxycycline 100 mg PO BID x 14 days Tylenol or Ibuprofen as needed for fever Rest and fluids  She will get COVID [facility] at [facility] when she leaves ER precautions discussed  Will follow up after labs, return precautions discussed Nicki Reaper, NP

## 2019-01-02 NOTE — Addendum Note (Signed)
Addended by: Lurlean Nanny on: 01/02/2019 04:26 PM   Modules accepted: Orders

## 2019-01-03 LAB — COMPREHENSIVE METABOLIC PANEL
ALT: 11 U/L (ref 0–35)
AST: 18 U/L (ref 0–37)
Albumin: 4.7 g/dL (ref 3.5–5.2)
Alkaline Phosphatase: 68 U/L (ref 39–117)
BUN: 6 mg/dL (ref 6–23)
CO2: 29 mEq/L (ref 19–32)
Calcium: 10.4 mg/dL (ref 8.4–10.5)
Chloride: 99 mEq/L (ref 96–112)
Creatinine, Ser: 0.78 mg/dL (ref 0.40–1.20)
GFR: 113.61 mL/min (ref >60.00)
Glucose, Bld: 92 mg/dL (ref 70–99)
Potassium: 3.9 mEq/L (ref 3.5–5.1)
Sodium: 137 mEq/L (ref 135–145)
Total Bilirubin: 0.8 mg/dL (ref 0.2–1.2)
Total Protein: 8.3 g/dL (ref 6.0–8.3)

## 2019-01-03 LAB — WET PREP BY MOLECULAR PROBE
Candida species: NOT DETECTED
Gardnerella vaginalis: NOT DETECTED
MICRO NUMBER:: 918593
SPECIMEN QUALITY:: ADEQUATE
Trichomonas vaginosis: NOT DETECTED

## 2019-01-03 LAB — C. TRACHOMATIS/N. GONORRHOEAE RNA
C. trachomatis RNA, TMA: NOT DETECTED
N. gonorrhoeae RNA, TMA: NOT DETECTED

## 2019-01-03 LAB — URINE CULTURE
MICRO NUMBER:: 918594
Result:: NO GROWTH
SPECIMEN QUALITY:: ADEQUATE

## 2019-01-03 LAB — CBC
HCT: 38.8 % (ref 36.0–46.0)
Hemoglobin: 12.6 g/dL (ref 12.0–15.0)
MCHC: 32.6 g/dL (ref 30.0–36.0)
MCV: 88.4 fl (ref 78.0–100.0)
Platelets: 294 10*3/uL (ref 150.0–400.0)
RBC: 4.39 Mil/uL (ref 3.87–5.11)
RDW: 14.9 % — ABNORMAL HIGH (ref 11.5–14.6)
WBC: 9 10*3/uL (ref 4.5–10.5)

## 2019-01-03 LAB — NOVEL CORONAVIRUS, NAA: SARS-CoV-2, NAA: NOT DETECTED

## 2019-01-03 LAB — HCG, QUANTITATIVE, PREGNANCY: Quantitative HCG: <0.6 m[IU]/mL

## 2019-01-06 ENCOUNTER — Ambulatory Visit: Payer: Managed Care, Other (non HMO) | Admitting: Obstetrics and Gynecology

## 2019-03-27 ENCOUNTER — Other Ambulatory Visit: Payer: Self-pay

## 2019-03-27 ENCOUNTER — Ambulatory Visit: Payer: Managed Care, Other (non HMO) | Attending: Internal Medicine

## 2019-03-27 DIAGNOSIS — Z20822 Contact with and (suspected) exposure to covid-19: Secondary | ICD-10-CM

## 2019-03-29 LAB — NOVEL CORONAVIRUS, NAA: SARS-CoV-2, NAA: NOT DETECTED

## 2019-03-29 MED ORDER — FLUCONAZOLE 150 MG PO TABS: ORAL_TABLET | ORAL | 0 refills | Status: DC

## 2019-03-29 NOTE — Telephone Encounter (Signed)
Called patient regarding symptoms of a yeast infection: cottage cheese discharge and mild itching x 2 days. Will call in RX for Diflucan 150 mgm x 2 to [facility] on [address]. Dalia Heading, CNM

## 2019-04-07 ENCOUNTER — Ambulatory Visit: Payer: Managed Care, Other (non HMO) | Attending: Internal Medicine

## 2019-04-07 DIAGNOSIS — Z20822 Contact with and (suspected) exposure to covid-19: Secondary | ICD-10-CM

## 2019-04-09 LAB — NOVEL CORONAVIRUS, NAA: SARS-CoV-2, NAA: NOT DETECTED

## 2019-04-14 ENCOUNTER — Ambulatory Visit: Payer: Managed Care, Other (non HMO) | Attending: Internal Medicine

## 2019-04-14 DIAGNOSIS — Z20822 Contact with and (suspected) exposure to covid-19: Secondary | ICD-10-CM

## 2019-04-15 LAB — NOVEL CORONAVIRUS, NAA: SARS-CoV-2, NAA: NOT DETECTED

## 2019-04-22 ENCOUNTER — Other Ambulatory Visit: Payer: Managed Care, Other (non HMO)

## 2019-05-09 ENCOUNTER — Ambulatory Visit: Payer: Managed Care, Other (non HMO) | Attending: Internal Medicine

## 2019-06-05 ENCOUNTER — Ambulatory Visit: Payer: Managed Care, Other (non HMO) | Attending: Internal Medicine

## 2019-06-05 DIAGNOSIS — Z20822 Contact with and (suspected) exposure to covid-19: Secondary | ICD-10-CM

## 2019-06-06 LAB — NOVEL CORONAVIRUS, NAA: SARS-CoV-2, NAA: NOT DETECTED

## 2019-06-25 ENCOUNTER — Ambulatory Visit (INDEPENDENT_AMBULATORY_CARE_PROVIDER_SITE_OTHER): Payer: Managed Care, Other (non HMO) | Admitting: Primary Care

## 2019-06-25 ENCOUNTER — Encounter: Payer: Self-pay | Admitting: Primary Care

## 2019-06-25 ENCOUNTER — Other Ambulatory Visit: Payer: Self-pay

## 2019-06-25 VITALS — BP 100/66 | HR 88 | Temp 97.4°F | Ht 60.0 in | Wt 96.2 lb

## 2019-06-25 DIAGNOSIS — L74510 Primary focal hyperhidrosis, axilla: Secondary | ICD-10-CM | POA: Diagnosis not present

## 2019-06-25 DIAGNOSIS — R631 Polydipsia: Secondary | ICD-10-CM | POA: Diagnosis not present

## 2019-06-25 DIAGNOSIS — R61 Generalized hyperhidrosis: Secondary | ICD-10-CM | POA: Insufficient documentation

## 2019-06-25 HISTORY — DX: Polydipsia: R63.1

## 2019-06-25 LAB — POC URINALSYSI DIPSTICK (AUTOMATED)
Bilirubin, UA: NEGATIVE
Blood, UA: NEGATIVE
Glucose, UA: NEGATIVE
Ketones, UA: NEGATIVE
Leukocytes, UA: NEGATIVE
Nitrite, UA: NEGATIVE
Protein, UA: NEGATIVE
Spec Grav, UA: 1.015 (ref 1.010–1.025)
Urobilinogen, UA: 0.2 E.U./dL
pH, UA: 8 (ref 5.0–8.0)

## 2019-06-25 MED ORDER — GLYCOPYRRONIUM TOSYLATE 2.4 % EX PADS: 1.0000 "application " | MEDICATED_PAD | Freq: Every day | CUTANEOUS | 0 refills | Status: DC

## 2019-06-25 NOTE — Assessment & Plan Note (Signed)
Chronic, unresponsive to OTC treatment.  Prescription for glycopyrronium 2.4% pad sent to pharmacy.  She will update.

## 2019-06-25 NOTE — Assessment & Plan Note (Signed)
Acute for the last week, unclear etiology at this point.  Weight appears stable, actually increased from July 2020. Exam today is benign.  UA today negative, no signs of glucose or ketones in the urine.  Check labs today including TSH, A1c, CBC, CMP. Encourage water consumption, discussed that juice is sugary and may increase her thirst.

## 2019-06-25 NOTE — Patient Instructions (Addendum)
Stop by the lab prior to leaving today. I will notify you of your results once received.   Use on of the glycopyrronium pads once daily to both armpits.  Please call or message me with an update.  Try to drink more water than juice, juice has a lot of sugar which can make you more thirsty.  It was a pleasure to see you today!   Hyperhidrosis Hyperhidrosis is a condition in which the body sweats a lot more than normal (excessively). Sweating is a necessary function for a human body. It is normal to sweat when you are hot, physically active, or anxious. However, hyperhidrosis is sweating to an excessive degree. Although the condition is not a serious one, it can make you feel embarrassed. There are two kinds of hyperhidrosis:  Primary hyperhidrosis. The sweating usually localizes in one part of your body, such as your underarms, or in a few areas, such as your feet, face, underarms, and hands. This is the more common kind of hyperhidrosis.  Secondary hyperhidrosis. This type usually affects your entire body. What are the causes? The cause of this condition depends on the kind of hyperhidrosis that you have.  Primary hyperhidrosis may be caused by sweat glands that are more active than normal.  Secondary hyperhidrosis may be caused by an underlying condition or by taking certain medicines, such as antidepressants or diabetes medicines. Possible conditions that may cause secondary hyperhidrosis include: ? Diabetes. ? Gout. ? Anxiety. ? Obesity. ? Menopause. ? Overactive thyroid (hyperthyroidism). ? Tumors. ? Frostbite. ? Certain types of cancers. ? Alcoholism. ? Injury to your nervous system. ? Stroke. ? Parkinson's disease. What increases the risk? You are more likely to develop primary hyperhidrosis if you have a family history of the condition. What are the signs or symptoms? Symptoms of this condition include:  Feeling like you are sweating constantly, even while you are not  being active.  Having skin that peels or gets paler or softer in the areas where you sweat the most.  Being able to see sweat on your skin. Other symptoms depend on the kind of hyperhidrosis that you have.  Symptoms of primary hyperhidrosis may include: ? Sweating in the same location on both sides of your body. ? Sweating only during the day and not while you are sleeping. ? Sweating in specific areas, such as your underarms, palms, feet, and face.  Symptoms of secondary hyperhidrosis may include: ? Sweating all over your body. ? Sweating even while you sleep. How is this diagnosed? This condition may be diagnosed by:  Medical history.  Physical exam. You may also have other tests, including:  Tests to measure the amount of sweat you produce and to show the areas where you sweat the most. These tests may involve: ? Using color-changing chemicals to show patterns of sweating on the skin. ? Weighing paper that has been applied to the skin. This will show the amount of sweat that your body produces. ? Measuring the amount of water that evaporates from the skin. ? Using infrared technology to show patterns of sweating on the skin.  Tests to check for other conditions that may be causing excess sweating. This may include blood, urine, or imaging tests. How is this treated? Treatment for this condition depends on the kind of hyperhidrosis that you have and the areas of your body that are affected. Your health care provider will also treat any underlying conditions. Treatment may include:  Medicines, such as: ? Antiperspirants. These  are medicines that stop sweat. ? Injectable medicines. These may include small injections of botulinum toxin. ? Oral medicines. These are taken by mouth to treat underlying conditions and other symptoms.  A procedure to: ? Temporarily turn off the sweat glands in your hands and feet (iontophoresis). ? Remove your sweat glands. ? Cut or destroy the  nerves so that they do not send a signal to the sweat glands (sympathectomy). Follow these instructions at home: Lifestyle   Limit or avoid foods or beverages that may increase your risk of sweating, such as: ? Spicy food. ? Caffeine. ? Alcohol. ? Foods that contain monosodium glutamate (MSG).  If your feet sweat: ? Wear sandals when possible. ? Do not wear cotton socks. Wear socks that remove or wick moisture from your feet. ? Wear leather shoes. ? Avoid wearing the same pair of shoes for two days in a row.  Try placing sweat pads under your clothes to prevent underarm sweat from showing.  Keep a journal of your sweat symptoms and when they occur. This may help you identify things that trigger your sweating. General instructions  Take over-the-counter and prescription medicines only as told by your health care provider.  Use antiperspirants as told by your health care provider.  Consider joining a hyperhidrosis support group.  Keep all follow-up visits as told by your health care provider. This is important. Contact a health care provider if:  You have new symptoms.  Your symptoms get worse. Summary  Hyperhidrosis is a condition in which the body sweats a lot more than normal (excessively).  With primary hyperhidrosis, the sweating usually localizes in one part of your body, such as your underarms, or in a few areas, such as your feet, face, underarms, and hands. It is caused by overactive sweat glands in the affected area.  With secondary hyperhidrosis, the sweating affects your entire body. This is caused by an underlying condition.  Treatment for this condition depends on the kind of hyperhidrosis that you have and the parts of your body that are affected. This information is not intended to replace advice given to you by your health care provider. Make sure you discuss any questions you have with your health care provider. Document Revised: 01/28/2019 Document  Reviewed: 03/30/2017 Elsevier Patient Education  2020 [employer].

## 2019-06-25 NOTE — Progress Notes (Signed)
Subjective:    Patient ID: Krista Flores, female    DOB: [birth year], 18 y.o.   MRN: [id number]  HPI  This visit occurred during the SARS-CoV-2 public health emergency.  Safety protocols were in place, including screening questions prior to the visit, additional usage of staff PPE, and extensive cleaning of exam room while observing appropriate contact time as indicated for disinfecting solutions.   Krista Flores is a 19 year old female with a history of chest pain, shortness of breath, GAD, chlamydia who presents today with a chief complaint of excessive thirst. She would also like to discuss chronic sweating to her axilla.   Excessive thirst for water and juice over the last one week. Typically she doesn't drink water so this is a big change.  She has finished several full juice drugs last week, also drinking 3 bottles of water a day.  She also reports weight loss, got down to 89 pounds on her scale at home, admits to a decrease in appetite. She is urinating a lot from drinking fluids. Over the past two days she's had more of an appetite.  She's noticed a low grade fever of 99.1-7. Due for her menstrual cycle. She denies vaginal symptoms, breast tenderness, nausea, abdominal pain. She is three days late on her menstrual cycle, no intercourse since December 2020. Not on OCP's. She did have menstrual cycles in January and February 2021, normal.  Also with chronic and recurring sweating to her arm pits, has to change her shirts 1-2 times daily even during cooler months, this began several years ago. Sweating is worse, has progressed over the last year. She's tried several OTC products without improvement.  One of the OTC antiperspirants caused breakouts to her skin.   Wt Readings from Last 3 Encounters:  06/25/19 96 lb 4 oz (43.7 kg)  01/02/19 95 lb (43.1 kg)  11/08/18 92 lb 9.6 oz (42 kg)     BP Readings from Last 3 Encounters:  06/25/19 100/66  01/02/19 106/70  11/08/18 90/60      Review of Systems  Constitutional: Positive for appetite change. Negative for chills and fatigue.  Respiratory: Negative for shortness of breath.   Cardiovascular: Negative for chest pain.  Gastrointestinal: Negative for abdominal pain and nausea.  Endocrine: Positive for polydipsia. Negative for polyphagia.  Genitourinary: Negative for dysuria.  Neurological: Negative for dizziness.       Past Medical History:  Diagnosis Date  . Anxiety   . Chlamydia 2017-2020   x4  . Chlamydia infection 08/15/2016   Positive Chlamydia on 08/08/2016 and 10/19/2016  . Frequent headaches   . UTI (lower urinary tract infection)      Social History   Socioeconomic History  . Marital status: Single    Spouse name: Not on file  . Number of children: 0  . Years of education: 69  . Highest education level: Not on file  Occupational History  . 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 Occupation: student    Comment: [employer]  Tobacco Use  . Smoking status: Never Smoker  . Smokeless tobacco: Never Used  Substance and Sexual Activity  . Alcohol use: No    Alcohol/week: 0.0 standard drinks  . Drug use: No  . Sexual activity: Not Currently    Partners: Male    Birth control/protection: None  Other Topics Concern  . Not on file  Social History Narrative   Student at [employer]   She would  like to a metrologists.    Enjoys spending time with friends.   Will be starting a job with [employer] in August 2019   Social Determinants of Health   Financial Resource Strain:   . Difficulty of Paying Living Expenses:   Food Insecurity:   . Worried About Charity fundraiser in the Last Year:   . Arboriculturist in the Last Year:   Transportation Needs:   . Film/video editor (Medical):   Marland Kitchen Lack of Transportation (Non-Medical):   Physical Activity:   . Days of Exercise per Week:   . Minutes of Exercise per Session:   Stress:   . Feeling of Stress :   Social Connections:    . Frequency of Communication with Friends and Family:   . Frequency of Social Gatherings with Friends and Family:   . Attends Religious Services:   . Active Member of Clubs or Organizations:   . Attends Archivist Meetings:   Marland Kitchen Marital Status:   Intimate Partner Violence:   . Fear of Current or Ex-Partner:   . Emotionally Abused:   Marland Kitchen Physically Abused:   . Sexually Abused:     Past Surgical History:  Procedure Laterality Date  . NO PAST SURGERIES      Family History  Problem Relation Age of Onset  . Arthritis Maternal Grandmother   . Stroke Maternal Grandmother   . Hypertension Maternal Grandmother   . Diabetes Maternal Grandmother   . Hypercholesterolemia Maternal Grandmother   . Hypertension Maternal Grandfather   . Hypercholesterolemia Maternal Grandfather   . Other Mother        acid reflux    Allergies  Allergen Reactions  . Penicillins Hives    No current outpatient medications on file prior to visit.   No current facility-administered medications on file prior to visit.    BP 100/66   Pulse 88   Temp (!) 97.4 F (36.3 C) (Temporal)   Ht 5' (1.524 m)   Wt 96 lb 4 oz (43.7 kg)   LMP 05/25/2019   SpO2 98%   BMI 18.80 kg/m    Objective:   Physical Exam  Constitutional: She appears well-nourished. She does not appear ill.  Cardiovascular: Normal rate and regular rhythm.  Respiratory: Effort normal and breath sounds normal.  GI: Soft. Bowel sounds are normal. There is no abdominal tenderness.  Musculoskeletal:     Cervical back: Neck supple.  Skin: Skin is warm and dry.  Psychiatric: She has a normal mood and affect.           Assessment & Plan:

## 2019-06-26 LAB — COMPREHENSIVE METABOLIC PANEL WITH GFR
ALT: 11 U/L (ref 0–35)
AST: 22 U/L (ref 0–37)
Albumin: 4.1 g/dL (ref 3.5–5.2)
Alkaline Phosphatase: 57 U/L (ref 39–117)
BUN: 11 mg/dL (ref 6–23)
CO2: 29 meq/L (ref 19–32)
Calcium: 9.5 mg/dL (ref 8.4–10.5)
Chloride: 103 meq/L (ref 96–112)
Creatinine, Ser: 0.7 mg/dL (ref 0.40–1.20)
GFR: 128.11 mL/min
Glucose, Bld: 80 mg/dL (ref 70–99)
Potassium: 3.9 meq/L (ref 3.5–5.1)
Sodium: 136 meq/L (ref 135–145)
Total Bilirubin: 0.2 mg/dL (ref 0.2–1.2)
Total Protein: 7.4 g/dL (ref 6.0–8.3)

## 2019-06-26 LAB — CBC
HCT: 35.8 % — ABNORMAL LOW (ref 36.0–46.0)
Hemoglobin: 11.8 g/dL — ABNORMAL LOW (ref 12.0–15.0)
MCHC: 32.9 g/dL (ref 30.0–36.0)
MCV: 90.8 fl (ref 78.0–100.0)
Platelets: 221 10*3/uL (ref 150.0–400.0)
RBC: 3.94 Mil/uL (ref 3.87–5.11)
RDW: 13.8 % (ref 11.5–14.6)
WBC: 5.3 10*3/uL (ref 4.5–10.5)

## 2019-06-26 LAB — HEMOGLOBIN A1C: Hgb A1c MFr Bld: 5.1 % (ref 4.6–6.5)

## 2019-06-26 LAB — TSH: TSH: 0.94 u[IU]/mL (ref 0.35–5.50)

## 2019-07-03 ENCOUNTER — Other Ambulatory Visit: Payer: Managed Care, Other (non HMO)

## 2019-07-24 ENCOUNTER — Other Ambulatory Visit: Payer: Managed Care, Other (non HMO)

## 2019-07-25 NOTE — Telephone Encounter (Signed)
From [facility], patient received a Meningo on 10/20/2009  Patient should have gotten 2nd at the age of 16-17 but does not look like it

## 2019-07-25 NOTE — Telephone Encounter (Signed)
Okay to provide second dose if no documentation of this being received.

## 2019-07-28 ENCOUNTER — Telehealth: Payer: Self-pay | Admitting: Primary Care

## 2019-07-28 NOTE — Telephone Encounter (Signed)
Pt scheduled for both.   Nothing further needed.

## 2019-07-28 NOTE — Telephone Encounter (Signed)
No need to restart. She can get 2nd Meningococcal with the Men B as well.

## 2019-07-28 NOTE — Telephone Encounter (Signed)
Pt calling, needs Meningococcal Conjugate vaccine by Aug 09, 2019  Pt received first of the series in 2011 -  10/20/2009 But never received the 2nd shot  Please advise if needs to restart the series, thanks.

## 2019-07-29 NOTE — Telephone Encounter (Signed)
Patient is schedule for 11/11/19 with [facility]

## 2019-07-31 ENCOUNTER — Ambulatory Visit (INDEPENDENT_AMBULATORY_CARE_PROVIDER_SITE_OTHER): Payer: Managed Care, Other (non HMO)

## 2019-07-31 DIAGNOSIS — Z23 Encounter for immunization: Secondary | ICD-10-CM

## 2019-08-11 ENCOUNTER — Other Ambulatory Visit: Payer: Managed Care, Other (non HMO)

## 2019-08-12 ENCOUNTER — Other Ambulatory Visit (HOSPITAL_COMMUNITY)
Admission: RE | Admit: 2019-08-12 | Discharge: 2019-08-12 | Disposition: A | Discharge status: Home / Self Care | Payer: Managed Care, Other (non HMO) | Source: Ambulatory Visit | Attending: Obstetrics | Admitting: Obstetrics

## 2019-08-12 ENCOUNTER — Ambulatory Visit (INDEPENDENT_AMBULATORY_CARE_PROVIDER_SITE_OTHER): Payer: Managed Care, Other (non HMO) | Admitting: Obstetrics

## 2019-08-12 ENCOUNTER — Other Ambulatory Visit: Payer: Self-pay

## 2019-08-12 ENCOUNTER — Encounter: Payer: Self-pay | Admitting: Obstetrics

## 2019-08-12 VITALS — BP 90/56 | HR 97 | Ht 60.0 in | Wt 90.0 lb

## 2019-08-12 DIAGNOSIS — Z113 Encounter for screening for infections with a predominantly sexual mode of transmission: Secondary | ICD-10-CM | POA: Insufficient documentation

## 2019-08-12 DIAGNOSIS — Z8619 Personal history of other infectious and parasitic diseases: Secondary | ICD-10-CM

## 2019-08-12 DIAGNOSIS — B373 Candidiasis of vulva and vagina: Secondary | ICD-10-CM

## 2019-08-12 DIAGNOSIS — Z3009 Encounter for other general counseling and advice on contraception: Secondary | ICD-10-CM

## 2019-08-12 DIAGNOSIS — B3731 Acute candidiasis of vulva and vagina: Secondary | ICD-10-CM

## 2019-08-12 LAB — POCT WET PREP (WET MOUNT)
Clue Cells Wet Prep Whiff POC: NEGATIVE
Trichomonas Wet Prep HPF POC: ABSENT
WBC, Wet Prep HPF POC: NONE SEEN

## 2019-08-12 NOTE — Patient Instructions (Signed)
Contraception Choices Contraception, also called birth control, means things to use or ways to try not to get pregnant. Hormonal birth control This kind of birth control uses hormones. Here are some types of hormonal birth control:  A tube that is put under skin of the arm (implant). The tube can stay in for as long as 3 years.  Shots to get every 3 months (injections).  Pills to take every day (birth control pills).  A patch to change 1 time each week for 3 weeks (birth control patch). After that, the patch is taken off for 1 week.  A ring to put in the vagina. The ring is left in for 3 weeks. Then it is taken out of the vagina for 1 week. Then a new ring is put in.  Pills to take after unprotected sex (emergency birth control pills). Barrier birth control Here are some types of barrier birth control:  A thin covering that is put on the penis before sex (female condom). The covering is thrown away after sex.  A soft, loose covering that is put in the vagina before sex (female condom). The covering is thrown away after sex.  A rubber bowl that sits over the cervix (diaphragm). The bowl must be made for you. The bowl is put into the vagina before sex. The bowl is left in for 6-8 hours after sex. It is taken out within 24 hours.  A small, soft cup that fits over the cervix (cervical cap). The cup must be made for you. The cup can be left in for 6-8 hours after sex. It is taken out within 48 hours.  A sponge that is put into the vagina before sex. It must be left in for at least 6 hours after sex. It must be taken out within 30 hours. Then it is thrown away.  A chemical that kills or stops sperm from getting into the uterus (spermicide). It may be a pill, cream, jelly, or foam to put in the vagina. The chemical should be used at least 10-15 minutes before sex. IUD (intrauterine) birth control An IUD is a small, T-shaped piece of plastic. It is put inside the uterus. There are two kinds:   Hormone IUD. This kind can stay in for 3-5 years.  Copper IUD. This kind can stay in for 10 years. Permanent birth control Here are some types of permanent birth control:  Surgery to block the fallopian tubes.  Having an insert put into each fallopian tube.  Surgery to tie off the tubes that carry sperm (vasectomy). Natural planning birth control Here are some types of natural planning birth control:  Not having sex on the days the woman could get pregnant.  Using a calendar: ? To keep track of the length of each period. ? To find out what days pregnancy can happen. ? To plan to not have sex on days when pregnancy can happen.  Watching for symptoms of ovulation and not having sex during ovulation. One way the woman can check for ovulation is to check her temperature.  Waiting to have sex until after ovulation. Summary  Contraception, also called birth control, means things to use or ways to try not to get pregnant.  Hormonal methods of birth control include implants, injections, pills, patches, vaginal rings, and emergency birth control pills.  Barrier methods of birth control can include female condoms, female condoms, diaphragms, cervical caps, sponges, and spermicides.  There are two types of IUD (intrauterine device) birth control.  An IUD can be put in a woman's uterus to prevent pregnancy for 3-5 years.  Permanent sterilization can be done through a procedure for males, females, or both.  Natural planning methods involve not having sex on the days when the woman could get pregnant. This information is not intended to replace advice given to you by your health care provider. Make sure you discuss any questions you have with your health care provider. Document Revised: 07/17/2018 Document Reviewed: 04/06/2016 Elsevier Patient Education  [place]. Pelvic Inflammatory Disease  Pelvic inflammatory disease (PID) is an infection in some or all of the female  reproductive organs. PID can be in the womb (uterus), ovaries, fallopian tubes, or nearby tissues that are inside the lower belly area (pelvis). PID can lead to problems if it is not treated. What are the causes?  Germs (bacteria) that are spread during sex. This is the most common cause.  Germs in the vagina that are not spread during sex.  Germs that travel up from the vagina or cervix to the reproductive organs after: ? The birth of a baby. ? A miscarriage. ? An abortion. ? Pelvic surgery. ? Insertion of an intrauterine device (IUD). ? A sexual assault. What increases the risk?  Being younger than 21 years old.  Having sex at a young age.  Having a history of STI (sexually transmitted infection) or PID.  Not using barrier birth control, such as condoms.  Having a lot of sex partners.  Having sex with someone who has symptoms of an STI.  Using a douche.  Having an IUD put in place. What are the signs or symptoms?  Pain in the belly area.  Fever.  Chills.  Discharge from the vagina that is not normal.  Bleeding from the womb that is not normal.  Pain soon after the end of a menstrual period.  Pain when you pee (urinate).  Pain with sex.  Feeling sick to your stomach (nauseous) or throwing up (vomiting). How is this treated?  Antibiotic medicines. In very bad cases, these may be given through an IV tube.  Surgery. This is rare.  Efforts to stop the spread of the infection. Sex partners may need to be treated. It may take weeks until you feel all better. Your doctor may test you for infection again after you finish treatment. You should also be checked for HIV (human immunodeficiency virus). Follow these instructions at home:  Take over-the-counter and prescription medicines only as told by your doctor.  If you were prescribed an antibiotic medicine, take it as told by your doctor. Do not stop taking it even if you start to feel better.  Do not have sex  until treatment is done or as told by your doctor.  Tell your sex partner if you have PID. Your partner may need to be treated.  Keep all follow-up visits as told by your doctor. This is important. Contact a doctor if:  You have more fluid or fluid that is not normal coming from your vagina.  Your pain does not improve.  You throw up.  You have a fever.  You cannot take your medicines.  Your partner has an STI.  You have pain when you pee. Get help right away if:  You have more pain in the belly area.  You have chills.  You are not better in 72 hours with treatment. Summary  Pelvic inflammatory disease (PID) is caused by an infection in some or all of the  female reproductive organs.  PID is a serious infection.  This infection is most often treated with antibiotics.  Do not have sex until treatment is done or as told by your doctor. This information is not intended to replace advice given to you by your health care provider. Make sure you discuss any questions you have with your health care provider. Document Revised: 12/13/2017 Document Reviewed: 12/19/2017 Elsevier Patient Education  2020 [employer]. Chlamydia, Female Chlamydia is an STD (sexually transmitted disease). It is a bacterial infection that spreads (is contagious) through sexual contact. Chlamydia can occur in different areas of the body, including:  The tube that moves urine from the bladder out of the body (urethra).  The lower part of the uterus (cervix).  The throat.  The rectum. This condition is not difficult to treat. However, if left untreated, chlamydia can lead to more serious health problems, including pelvic inflammatory disorder (PID). PID can increase your risk of not being able to have children (sterility). Also, if chlamydia is left untreated and you are pregnant or become pregnant, there is a chance that your baby can become infected during delivery. This may cause serious health  problems for the baby. What are the causes? Chlamydia is caused by the bacteria Chlamydia trachomatis. It is passed from an infected partner during sexual activity. Chlamydia can spread through contact with the genitals, mouth, or rectum. What are the signs or symptoms? In some cases, there may not be any symptoms for this condition (asymptomatic), especially early in the infection. If symptoms develop, they may include:  Burning with urination.  Frequent urination.  Vaginal discharge.  Redness, soreness, and swelling (inflammation) of the rectum.  Bleeding or discharge from the rectum.  Abdominal pain.  Pain during sexual intercourse.  Bleeding between menstrual periods.  Itching, burning, or redness in the eyes, or discharge from the eyes. How is this diagnosed? This condition may be diagnosed with:  Urine tests.  Swab tests. Depending on your symptoms, your health care provider may use a cotton swab to collect discharge from your vagina or rectum to test for the bacteria.  A pelvic exam. How is this treated? This condition is treated with antibiotic medicines. If you are pregnant, certain types of antibiotics will need to be avoided. Follow these instructions at home: Medicines  Take over-the-counter and prescription medicines only as told by your health care provider.  Take your antibiotic medicine as told by your health care provider. Do not stop taking the antibiotic even if you start to feel better. Sexual activity  Tell sexual partners about your infection. This includes any oral, anal, or vaginal sex partners you have had within 60 days of when your symptoms started. Sexual partners should also be treated, even if they have no signs of the disease.  Do not have sex until you and your sexual partners have completed treatment and your health care provider says it is okay. If your health care provider prescribed you a single dose treatment, wait 7 days after taking  the treatment before having sex. General instructions  It is your responsibility to get your test results. Ask your health care provider, or the department performing the test, when your results will be ready.  Get plenty of rest.  Eat a healthy, well-balanced diet.  Drink enough fluids to keep your urine clear or pale yellow.  Keep all follow-up visits as told by your health care provider. This is important. You may need to be tested for infection  again 3 months after treatment. How is this prevented? The only sure way to prevent chlamydia is to avoid having sex. However, you can lower your risk by:  Using latex condoms correctly every time you have sex.  Not having multiple sexual partners.  Asking if your sexual partner has been tested for STIs and had negative results. Contact a health care provider if:  You develop new symptoms or your symptoms do not get better after completing treatment.  You have a fever or chills.  You have pain during sexual intercourse. Get help right away if:  Your pain gets worse and does not get better with medicine.  You develop flu-like symptoms, such as night sweats, sore throat, or muscle aches.  You experience nausea or vomiting.  You have difficulty swallowing.  You have bleeding between periods or after sex.  You have irregular menstrual periods.  You have abdominal or lower back pain that does not get better with medicine.  You feel weak or dizzy, or you faint.  You are pregnant and you develop symptoms of chlamydia. Summary  Chlamydia is an STD (sexually transmitted disease). It is a bacterial infection that spreads (is contagious) through sexual contact.  This condition is not difficult to treat, however. If left untreated, chlamydia can lead to more serious health problems, including pelvic inflammatory disease (PID).  In some cases, there may not be any symptoms for this condition (asymptomatic).  This condition is  treated with antibiotic medicines.  Using latex condoms correctly every time you have sex can help prevent chlamydia. This information is not intended to replace advice given to you by your health care provider. Make sure you discuss any questions you have with your health care provider. Document Revised: 09/18/2017 Document Reviewed: 03/13/2016 Elsevier Patient Education  2020 [employer].

## 2019-08-12 NOTE — Progress Notes (Signed)
  Gynecology STD Evaluation   Chief Complaint:  Chief Complaint  Patient presents with  . Gynecologic Exam    STI testing/No known exposure    History of Present Illness: Patient is a 19 y.o. G0P0000 presents for STD testing.  The patient has not noted intermenstrual spotting,  has not experienced postcoital bleeding, and does report increased vaginal discharge.  There is a history of prior sexually transmitted infection(s).  Neve has been treated 4 times for Chlamydia in the last 4-5 years, which she reports came from 2 previous partners. She shares that she likes to have limited STI testing every few months.   The patient is sexually active and reports multiple lifetime sexual partners . She currently uses None for contraception. sometimes, the patient also relies on condoms to prevent the spread of sexually transmitted infections.  She patient has not been previously transfused or tattooed.  Previously she has tried the [employer], Depo, and OCPs for birth control, but she is no longer using those, and shares that these "mess" with her cycles. She does not want to be pregnant.  PMHx: She  has a past medical history of Anxiety, Chlamydia (2017-2020), Chlamydia infection (08/15/2016), Frequent headaches, and UTI (lower urinary tract infection). Also,  has a past surgical history that includes No past surgeries., family history includes Arthritis in her maternal grandmother; Diabetes in her maternal grandmother; Hypercholesterolemia in her maternal grandfather and maternal grandmother; Hypertension in her maternal grandfather and maternal grandmother; Other in her mother; Stroke in her maternal grandmother.,  reports that she has never smoked. She has never used smokeless tobacco. She reports that she does not drink alcohol or use drugs.  She has a current medication list which includes the following prescription(s): vitamin c with rose hips and glycopyrronium tosylate. Also, is allergic to  penicillins.  ROS  Objective: BP (!) 90/56 (BP Location: Left Arm, Patient Position: Sitting, Cuff Size: Normal)   Pulse 97   Ht 5' (1.524 m)   Wt 90 lb (40.8 kg)   LMP 07/17/2019   BMI 17.58 kg/m  OBGyn Exam  Pelvic:  Entire mons shaved. No rashes, lesions or irritation noted.   There is a tiny laceration at the introitus noted which is tender to touch.  A moderate amount of thick white discharge noted in the vagina. (see wet mount)  Assessment: 18 y.o. G0P0000 No problem-specific Assessment & Plan notes found for this encounter.   Plan: Problem List Items Addressed This Visit    None    Visit Diagnoses    Routine screening for STI (sexually transmitted infection)    -  Primary       1) Contraception - Education given regarding options for contraception, including barrier methods, injectable contraception, IUD placement, oral contraceptives.  She is not interested in a Rx for BC,at present, but will rely on condoms. Encouraged to consider resuming more formal contraception or opting for condoms more consistently.   2) STI screening was offered and a culture for GC/CT was sent.  We will contact her with the results of the culture.   3) STD prevention, HIV risk factors and prevention and family planning choices provided.  4) Wet mount performed- yeast vaginitis dx.  5) She is instructed to purchase OTC Monistat cream and avoid IC until she has treated the yeast.  Follw up in 3 months for her nex annual PE or PRN.  Mirna Mires, CNM

## 2019-08-14 ENCOUNTER — Encounter: Payer: Self-pay | Admitting: Obstetrics

## 2019-08-14 LAB — CERVICOVAGINAL ANCILLARY ONLY
Chlamydia: NEGATIVE
Comment: NEGATIVE
Comment: NORMAL
Neisseria Gonorrhea: NEGATIVE

## 2019-08-14 NOTE — Progress Notes (Unsigned)
Recentl STi testing negaitve. Letter sent via MY Chart to patient. Mirna Mires, CNM

## 2019-09-05 ENCOUNTER — Encounter: Payer: Self-pay | Admitting: Primary Care

## 2019-09-05 ENCOUNTER — Other Ambulatory Visit: Payer: Self-pay

## 2019-09-05 ENCOUNTER — Ambulatory Visit (INDEPENDENT_AMBULATORY_CARE_PROVIDER_SITE_OTHER): Payer: Managed Care, Other (non HMO) | Admitting: Primary Care

## 2019-09-05 VITALS — BP 100/66 | HR 68 | Temp 96.5°F | Ht 60.0 in | Wt 89.8 lb

## 2019-09-05 DIAGNOSIS — R636 Underweight: Secondary | ICD-10-CM | POA: Diagnosis not present

## 2019-09-05 DIAGNOSIS — L74510 Primary focal hyperhidrosis, axilla: Secondary | ICD-10-CM

## 2019-09-05 DIAGNOSIS — R630 Anorexia: Secondary | ICD-10-CM | POA: Insufficient documentation

## 2019-09-05 LAB — CBC
HCT: 37.8 % (ref 36.0–46.0)
Hemoglobin: 12.5 g/dL (ref 12.0–15.0)
MCHC: 33.2 g/dL (ref 30.0–36.0)
MCV: 90.2 fl (ref 78.0–100.0)
Platelets: 250 10*3/uL (ref 150.0–400.0)
RBC: 4.19 Mil/uL (ref 3.87–5.11)
RDW: 13.6 % (ref 11.5–14.6)
WBC: 4.7 10*3/uL (ref 4.5–10.5)

## 2019-09-05 LAB — BASIC METABOLIC PANEL
BUN: 14 mg/dL (ref 6–23)
CO2: 26 mEq/L (ref 19–32)
Calcium: 9.5 mg/dL (ref 8.4–10.5)
Chloride: 102 mEq/L (ref 96–112)
Creatinine, Ser: 0.7 mg/dL (ref 0.40–1.20)
GFR: 127.87 mL/min (ref >60.00)
Glucose, Bld: 85 mg/dL (ref 70–99)
Potassium: 4.1 mEq/L (ref 3.5–5.1)
Sodium: 135 mEq/L (ref 135–145)

## 2019-09-05 LAB — TSH: TSH: 0.87 u[IU]/mL (ref 0.35–5.50)

## 2019-09-05 MED ORDER — GLYCOPYRROLATE 1 MG PO TABS: 1.0000 mg | ORAL_TABLET | Freq: Every day | ORAL | 0 refills | Status: DC

## 2019-09-05 NOTE — Assessment & Plan Note (Signed)
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 She does have a chronic history of being overweight, but she has lost 7 pounds since March 2021.  Lower suspicion for eating disorder, no obvious signs. Checking labs today. Referral placed to nutritionist, she is very motivated to be evaluated.  Strongly advised she stop smoking marijuana.

## 2019-09-05 NOTE — Assessment & Plan Note (Signed)
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 She does have a chronic history of being overweight, but she has lost 7 pounds since March 2021.  Lower suspicion for eating disorder, no obvious signs. Checking labs today. Referral placed to nutritionist, she is very motivated to be evaluated.  Strongly advised she stop smoking marijuana.  

## 2019-09-05 NOTE — Patient Instructions (Addendum)
I recommend you stop smoking.  Stop by the lab prior to leaving today. I will notify you of your results once received.   You will be contacted regarding your referral to the nutritionist.  Please let us know if you have not been contacted within two weeks.   Start glycopyrrolate 1 mg tablets for sweating. Take 1 tablet by mouth once daily. Please update me in a few weeks.  It was a pleasure to see you today!

## 2019-09-05 NOTE — Assessment & Plan Note (Signed)
Topical glycopyrronium cost prohibitive. Trial of oral glycopyrrolate sent to pharmacy. Discussed potential side effects. She will update in a few weeks.

## 2019-09-05 NOTE — Progress Notes (Signed)
Subjective:    Patient ID: Krista Flores, female    DOB: [birth year], 19 y.o.   MRN: [id number]  HPI  This visit occurred during the SARS-CoV-2 public health emergency.  Safety protocols were in place, including screening questions prior to the visit, additional usage of staff PPE, and extensive cleaning of exam room while observing appropriate contact time as indicated for disinfecting solutions.   Krista Flores is a 19 year old female with a history of anxiety disorder, hyperhidrosis of axilla, chlamydia who presents today with a chief complaint of appetite change. She would also like to revisit her excessive sweating.   1) Appetite Change:   Decrease in appetite that has been intermittent since March 2021. Some days she feels hungry, some days she has no appetite. She forces herself to eat on days she's not hungry. She eats something everyday. She mostly snacks, not many large meals.   She denies intentional weight loss, excessive exercise, vomiting, purging.   She has always been underwent, the highest weight she can remember throughout her adult life was 105 pounds which was about 2 years ago.  The only dietary changes she recalls is cutting out steak. She is a "heavy weed smoker". Mostly drinks water.   Diet currently consists of:  Breakfast: None Lunch: Frozen meal, small portion  Dinner: Fast food, some home cooked meals. Mostly skips.  Snacks: Pretzels, cereal, chips Desserts: Occasionally  Beverages: Water, juice, lemonade, sweet tea  Exercise: None  2) Hyperhidrosis: Of axilla. Chronic and recurring sweating to axilla bilaterally. During her last visit we discussed this, she endorsed changing clothing frequently. We sent in a prescription for topical glycopyrronium pads but this was over $500. Since then she's continued to try special order antiperspirants but they have been ineffective.    BP Readings from Last 3 Encounters:  09/05/19 100/66  08/12/19 (!) 90/56    06/25/19 100/66   Wt Readings from Last 3 Encounters:  09/05/19 89 lb 12 oz (40.7 kg)  08/12/19 90 lb (40.8 kg)  06/25/19 96 lb 4 oz (43.7 kg)     Review of Systems  Eyes: Negative for visual disturbance.  Respiratory: Negative for shortness of breath.   Cardiovascular: Negative for chest pain.  Skin:       Excessive sweating  Neurological: Negative for dizziness and headaches.       Past Medical History:  Diagnosis Date  . Anxiety   . Chlamydia 2017-2020   x4  . Chlamydia infection 08/15/2016   Positive Chlamydia on 08/08/2016 and 10/19/2016  . Frequent headaches   . UTI (lower urinary tract infection)      Social History   Socioeconomic History  . Marital status: Single    Spouse name: Not on file  . Number of children: 0  . Years of education: 40  . Highest education level: Not on file  Occupational History  . 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 Occupation: student    Comment: [employer]  Tobacco Use  . Smoking status: Never Smoker  . Smokeless tobacco: Never Used  Substance and Sexual Activity  . Alcohol use: No    Alcohol/week: 0.0 standard drinks  . Drug use: No  . Sexual activity: Not Currently    Partners: Male    Birth control/protection: None  Other Topics Concern  . Not on file  Social History Narrative   Student at [employer]   She would like to a metrologists.  Enjoys spending time with friends.   Will be starting a job with [employer] in August 2019   Social Determinants of Health   Financial Resource Strain:   . Difficulty of Paying Living Expenses:   Food Insecurity:   . Worried About Programme researcher, broadcasting/film/video in the Last Year:   . Barista in the Last Year:   Transportation Needs:   . Freight forwarder (Medical):   Marland Kitchen Lack of Transportation (Non-Medical):   Physical Activity:   . Days of Exercise per Week:   . Minutes of Exercise per Session:   Stress:   . Feeling of Stress :   Social Connections:   .  Frequency of Communication with Friends and Family:   . Frequency of Social Gatherings with Friends and Family:   . Attends Religious Services:   . Active Member of Clubs or Organizations:   . Attends Banker Meetings:   Marland Kitchen Marital Status:   Intimate Partner Violence:   . Fear of Current or Ex-Partner:   . Emotionally Abused:   Marland Kitchen Physically Abused:   . Sexually Abused:     Past Surgical History:  Procedure Laterality Date  . NO PAST SURGERIES      Family History  Problem Relation Age of Onset  . Arthritis Maternal Grandmother   . Stroke Maternal Grandmother   . Hypertension Maternal Grandmother   . Diabetes Maternal Grandmother   . Hypercholesterolemia Maternal Grandmother   . Hypertension Maternal Grandfather   . Hypercholesterolemia Maternal Grandfather   . Other Mother        acid reflux    Allergies  Allergen Reactions  . Penicillins Hives    Current Outpatient Medications on File Prior to Visit  Medication Sig Dispense Refill  . Ascorbic Acid (VITAMIN C WITH ROSE HIPS) 500 MG tablet Take 500 mg by mouth daily.     No current facility-administered medications on file prior to visit.    BP 100/66   Pulse 68   Temp (!) 96.5 F (35.8 C) (Temporal)   Ht 5' (1.524 m)   Wt 89 lb 12 oz (40.7 kg)   SpO2 100%   BMI 17.53 kg/m    Objective:   Physical Exam  Constitutional: She appears well-nourished.  Cardiovascular: Normal rate and regular rhythm.  Respiratory: Effort normal and breath sounds normal.  Musculoskeletal:     Cervical back: Neck supple.  Skin: Skin is warm and dry.  Psychiatric: She has a normal mood and affect.           Assessment & Plan:

## 2019-09-06 LAB — HIV ANTIBODY (ROUTINE TESTING W REFLEX): HIV 1&2 Ab, 4th Generation: NONREACTIVE

## 2019-09-11 ENCOUNTER — Other Ambulatory Visit: Payer: Self-pay

## 2019-09-11 ENCOUNTER — Ambulatory Visit (INDEPENDENT_AMBULATORY_CARE_PROVIDER_SITE_OTHER): Payer: Managed Care, Other (non HMO) | Admitting: Obstetrics

## 2019-09-11 ENCOUNTER — Encounter: Payer: Self-pay | Admitting: Obstetrics

## 2019-09-11 VITALS — BP 98/65 | Ht 60.0 in | Wt 89.4 lb

## 2019-09-11 DIAGNOSIS — Z113 Encounter for screening for infections with a predominantly sexual mode of transmission: Secondary | ICD-10-CM

## 2019-09-11 DIAGNOSIS — N898 Other specified noninflammatory disorders of vagina: Secondary | ICD-10-CM

## 2019-09-11 LAB — POCT URINE PREGNANCY: Preg Test, Ur: NEGATIVE

## 2019-09-11 NOTE — Progress Notes (Signed)
Obstetrics & Gynecology Office Visit   Chief Complaint:  Chief Complaint  Patient presents with  . Gynecologic Exam    poss yeast infection and STD Testing    History of Present Illness: Krista Flores returns to the office requesting STI screening. She had sex with a new partner last week, and shares that she wants to be checked for Chlamydia. Her hx is significant for multiple CT infections.She admits that she was under the influence when shehad IC with this new partner, and does nto know his STI status.  She denies any vaginal discharge today; deneis malodor, itching, or any skin eruptions in her genital area. She is not contracepting; has tried Depo, the [employer], and OCPs in the past and "they messed me up".  She has had irregular vaginal bleeding with the Depo, and did not remember to take the OCPs. She still has several Nuva Rings at home; but has been afraind that they would get stuck in her birht canal and she would not be able to remove one if she tried it.   Review of Systems:  ROS unremarkable with the exception of those Systems listed below.  Past Medical History:  Past Medical History:  Diagnosis Date  . Anxiety   . Chlamydia 2017-2020   x4  . Chlamydia infection 08/15/2016   Positive Chlamydia on 08/08/2016 and 10/19/2016  . Frequent headaches   . UTI (lower urinary tract infection)     Past Surgical History:  Past Surgical History:  Procedure Laterality Date  . NO PAST SURGERIES      Gynecologic History: Patient's last menstrual period was 08/16/2019.  Obstetric History: G0P0000  Family History:  Family History  Problem Relation Age of Onset  . Arthritis Maternal Grandmother   . Stroke Maternal Grandmother   . Hypertension Maternal Grandmother   . Diabetes Maternal Grandmother   . Hypercholesterolemia Maternal Grandmother   . Hypertension Maternal Grandfather   . Hypercholesterolemia Maternal Grandfather   . Other Mother        acid reflux    Social  History:  Social History   Socioeconomic History  . Marital status: Single    Spouse name: Not on file  . Number of children: 0  . Years of education: 59  . Highest education level: Not on file  Occupational History  . 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 Occupation: student    Comment: [employer]  Tobacco Use  . Smoking status: Never Smoker  . Smokeless tobacco: Never Used  Substance and Sexual Activity  . Alcohol use: No    Alcohol/week: 0.0 standard drinks  . Drug use: Yes    Types: Marijuana    Comment: heavy use  . Sexual activity: Not Currently    Partners: Male    Birth control/protection: None  Other Topics Concern  . Not on file  Social History Narrative   Student at [employer]   She would like to a metrologists.    Enjoys spending time with friends.   Will be starting a job with [employer] in August 2019   Social Determinants of Health   Financial Resource Strain:   . Difficulty of Paying Living Expenses:   Food Insecurity:   . Worried About Charity fundraiser in the Last Year:   . Arboriculturist in the Last Year:   Transportation Needs:   . Film/video editor (Medical):   Marland Kitchen Lack of Transportation (Non-Medical):  Physical Activity:   . Days of Exercise per Week:   . Minutes of Exercise per Session:   Stress:   . Feeling of Stress :   Social Connections:   . Frequency of Communication with Friends and Family:   . Frequency of Social Gatherings with Friends and Family:   . Attends Religious Services:   . Active Member of Clubs or Organizations:   . Attends Banker Meetings:   Marland Kitchen Marital Status:   Intimate Partner Violence:   . Fear of Current or Ex-Partner:   . Emotionally Abused:   Marland Kitchen Physically Abused:   . Sexually Abused:     Allergies:  Allergies  Allergen Reactions  . Penicillins Hives    Medications: Prior to Admission medications   Not on File    Physical Exam Vitals:  Vitals:   09/11/19 1514    BP: 98/65   Patient's last menstrual period was 08/16/2019.  Physical Exam focuses on the pelvic exam today: shaved mons noted. No rashes or lesions noted. Spec exam reveals little discharge. Nulliparous cervix. No malodor noted.Normal vaginal mucosa. No CMT noted. Uterus iis non enlarged.  Urine pregnancy test is negative.  There is a strong smell of Marijuana noted in the exam room.   Assessment: 19 y.o. G0P0000 for STI screening Need for contraceptive plan. + MJ use.   Plan: Problem List Items Addressed This Visit      Other   Routine screening for STI (sexually transmitted infection)   Relevant Orders   POCT urine pregnancy (Completed)    Other Visit Diagnoses    Vaginal discharge    -  Primary   Relevant Orders   POCT Wet Prep [facility])   POCT urine pregnancy (Completed)     Plan:  Aptima swab retrieved. Will notify her of the results via My Chart or phone. Approximately 15 minutes spent discussing a contraceptive plan. We reviewed the Nexplanon, the Nuva Ring, OCPs, Depo with her. She has several Nuva Rings at home and careful instructions are given regarding how to use this method.  Encouraged to start on a daily multivitamin with folic acid. Discussed her high risk for STDs, HIV, unplanned pregnancy. She verbalizes understanding. RTC PRN.   Mirna Mires, CNM  09/11/2019 4:15 PM

## 2019-09-13 LAB — GC/CHLAMYDIA PROBE AMP
Chlamydia trachomatis, NAA: NEGATIVE
Neisseria Gonorrhoeae by PCR: NEGATIVE

## 2019-09-17 ENCOUNTER — Other Ambulatory Visit: Payer: Self-pay | Admitting: Obstetrics

## 2019-09-17 DIAGNOSIS — N898 Other specified noninflammatory disorders of vagina: Secondary | ICD-10-CM

## 2019-09-17 MED ORDER — METRONIDAZOLE 500 MG PO TABS: 500.0000 mg | ORAL_TABLET | Freq: Two times a day (BID) | ORAL | 0 refills | Status: AC

## 2019-09-17 NOTE — Progress Notes (Signed)
Responded to patient's request for medication to treat vaginal BV and malodor. Her wet mount did not show many clue cells however. Rx sent to pharmacy for Flagyl. Mirna Mires, CNM  09/17/2019 5:58 PM

## 2019-09-17 NOTE — Telephone Encounter (Signed)
Shirlee Limerick, what's up with the nutritionist referral?

## 2019-09-19 ENCOUNTER — Other Ambulatory Visit: Payer: Managed Care, Other (non HMO)

## 2019-09-28 ENCOUNTER — Other Ambulatory Visit: Payer: Self-pay | Admitting: Primary Care

## 2019-09-28 DIAGNOSIS — L74510 Primary focal hyperhidrosis, axilla: Secondary | ICD-10-CM

## 2019-10-02 ENCOUNTER — Other Ambulatory Visit: Payer: Managed Care, Other (non HMO)

## 2019-10-16 ENCOUNTER — Ambulatory Visit: Payer: Managed Care, Other (non HMO) | Attending: Internal Medicine

## 2019-10-16 DIAGNOSIS — Z20822 Contact with and (suspected) exposure to covid-19: Secondary | ICD-10-CM

## 2019-10-17 ENCOUNTER — Ambulatory Visit: Payer: Managed Care, Other (non HMO) | Attending: Internal Medicine

## 2019-10-17 DIAGNOSIS — Z20822 Contact with and (suspected) exposure to covid-19: Secondary | ICD-10-CM

## 2019-10-17 LAB — NOVEL CORONAVIRUS, NAA

## 2019-10-18 LAB — NOVEL CORONAVIRUS, NAA: SARS-CoV-2, NAA: NOT DETECTED

## 2019-10-18 LAB — SARS-COV-2, NAA 2 DAY TAT

## 2019-10-23 ENCOUNTER — Other Ambulatory Visit: Payer: Managed Care, Other (non HMO)

## 2019-11-11 ENCOUNTER — Other Ambulatory Visit (HOSPITAL_COMMUNITY)
Admission: RE | Admit: 2019-11-11 | Discharge: 2019-11-11 | Disposition: A | Discharge status: Home / Self Care | Payer: Managed Care, Other (non HMO) | Source: Ambulatory Visit | Attending: Certified Nurse Midwife | Admitting: Certified Nurse Midwife

## 2019-11-11 ENCOUNTER — Encounter: Payer: Self-pay | Admitting: Certified Nurse Midwife

## 2019-11-11 ENCOUNTER — Ambulatory Visit (INDEPENDENT_AMBULATORY_CARE_PROVIDER_SITE_OTHER): Payer: Managed Care, Other (non HMO) | Admitting: Certified Nurse Midwife

## 2019-11-11 ENCOUNTER — Other Ambulatory Visit: Payer: Self-pay

## 2019-11-11 VITALS — BP 90/62 | HR 55 | Ht 60.0 in | Wt 90.0 lb

## 2019-11-11 DIAGNOSIS — Z01419 Encounter for gynecological examination (general) (routine) without abnormal findings: Secondary | ICD-10-CM | POA: Diagnosis not present

## 2019-11-11 DIAGNOSIS — B3731 Acute candidiasis of vulva and vagina: Secondary | ICD-10-CM

## 2019-11-11 DIAGNOSIS — N898 Other specified noninflammatory disorders of vagina: Secondary | ICD-10-CM

## 2019-11-11 DIAGNOSIS — Z124 Encounter for screening for malignant neoplasm of cervix: Secondary | ICD-10-CM | POA: Insufficient documentation

## 2019-11-11 DIAGNOSIS — Z113 Encounter for screening for infections with a predominantly sexual mode of transmission: Secondary | ICD-10-CM

## 2019-11-11 DIAGNOSIS — Z23 Encounter for immunization: Secondary | ICD-10-CM

## 2019-11-11 DIAGNOSIS — B373 Candidiasis of vulva and vagina: Secondary | ICD-10-CM

## 2019-11-11 MED ORDER — FLUCONAZOLE 150 MG PO TABS: ORAL_TABLET | ORAL | 0 refills | Status: DC

## 2019-11-11 NOTE — Progress Notes (Signed)
Gynecology Annual Exam  PCP: [name], NP  Chief Complaint:  Chief Complaint  Patient presents with  . Gynecologic Exam    History of Present Illness: Krista Flores is a 19 y.o. BF G0P0000 who presents for her annual gyn exam. Her menses are monthly and her LMP was 10/16/2019. They last 7 days and her first few days are heavier requiring tampon change 3-4x/day. She reports cramping in the first few days of her menses which is relieved by her grandmother's thyme tea.  She is not currently sexually active and is not currently using birth control. Has used Depo provera, pills and more recently tried the Nuvaring. She stopped these methods due to menstrual irregularities. She is using condoms Her last annual exam was 11/08/2018. She had negative STD screening at that time, and again 12/2019, 5/21 and 09/11/19. She was treated for BV 09/17/2019. She has not had a PAP in the past due to age. Past medical history is remarkable for anxiety and Chlamydia infections. She has received her Pfizer vaccine   The patient does not perform self breast exams.  There is no notable family history of breast or ovarian cancer in her family.  The patient has regular exercise:just started back exercising. Hs been working from home.  Current BMI is 17.58  kg/m2 She does not smoke. Occasional alcohol intake. She does use marijuana on occasion. She is lactose intolerant and does not get adequate calcium in her diet but is taking calcium gummies  The patient denies current symptoms of depression.    Review of Systems: Review of Systems  Constitutional: Negative for chills, fever and weight loss.  HENT: Negative for congestion, sinus pain and sore throat.   Eyes: Negative for blurred vision, pain and redness.  Respiratory: Negative for hemoptysis, shortness of breath and wheezing.   Cardiovascular: Negative for chest pain, palpitations and leg swelling.  Gastrointestinal: Negative for abdominal pain, blood in  stool, diarrhea, heartburn, nausea and vomiting.  Genitourinary: Negative for dysuria, frequency, hematuria and urgency.       Positive for dysmenorrhea  Musculoskeletal: Negative for back pain, joint pain and myalgias.  Skin: Negative for itching and rash.  Neurological: Negative for dizziness, tingling and headaches.  Endo/Heme/Allergies: Negative for environmental allergies and polydipsia. Does not bruise/bleed easily.          Psychiatric/Behavioral: Negative for depression. The patient is not nervous/anxious and does not have insomnia.     Past Medical History:  Past Medical History:  Diagnosis Date  . Anxiety   . Chlamydia 2017-2020   x4  . Chlamydia infection 08/15/2016   Positive Chlamydia on 08/08/2016 and 10/19/2016  . Frequent headaches   . UTI (lower urinary tract infection)     Past Surgical History:  Past Surgical History:  Procedure Laterality Date  . NO PAST SURGERIES       Family History:  Family History  Problem Relation Age of Onset  . Arthritis Maternal Grandmother   . Stroke Maternal Grandmother   . Hypertension Maternal Grandmother   . Diabetes Maternal Grandmother   . Hypercholesterolemia Maternal Grandmother   . Hypertension Maternal Grandfather   . Hypercholesterolemia Maternal Grandfather   . Other Mother        acid reflux    Social History:  Social History   Socioeconomic History  . Marital status: Single    Spouse name: Not on file  . Number of children: 0  . Years of education: 63  .  Highest education level: Not on file  Occupational History  . 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 Occupation: student    Comment: [employer]  Tobacco Use  . Smoking status: Never Smoker  . Smokeless tobacco: Never Used  Vaping Use  . Vaping Use: Never used  Substance and Sexual Activity  . Alcohol use: No    Alcohol/week: 0.0 standard drinks  . Drug use: Yes    Types: Marijuana    Comment: heavy use  . Sexual  activity: Not Currently    Partners: Male    Birth control/protection: None  Other Topics Concern  . Not on file  Social History Narrative   Student at [employer]   She would like to a metrologists.    Enjoys spending time with friends.   Will be starting a job with [employer] in August 2019   Social Determinants of Health   Financial Resource Strain:   . Difficulty of Paying Living Expenses:   Food Insecurity:   . Worried About Programme researcher, broadcasting/film/video in the Last Year:   . Barista in the Last Year:   Transportation Needs:   . Freight forwarder (Medical):   Marland Kitchen Lack of Transportation (Non-Medical):   Physical Activity:   . Days of Exercise per Week:   . Minutes of Exercise per Session:   Stress:   . Feeling of Stress :   Social Connections:   . Frequency of Communication with Friends and Family:   . Frequency of Social Gatherings with Friends and Family:   . Attends Religious Services:   . Active Member of Clubs or Organizations:   . Attends Banker Meetings:   Marland Kitchen Marital Status:   Intimate Partner Violence:   . Fear of Current or Ex-Partner:   . Emotionally Abused:   Marland Kitchen Physically Abused:   . Sexually Abused:     Allergies:  Allergies  Allergen Reactions  . Penicillins Hives    Medications: none Physical Exam Vitals: BP 90/62   Pulse (!) 55   Ht 5' (1.524 m)   Wt 90 lb (40.8 kg)   LMP 10/16/2019 (Exact Date)   BMI 17.58 kg/m .  General: petite BF in NAD HEENT: normocephalic, anicteric Thyroid: no enlargement, no palpable nodules Pulmonary: No increased work of breathing, CTAB Cardiovascular: RRR without murmur Breast: Breast symmetrical in size, no tenderness,  no skin or nipple retraction present, no nipple discharge.  There is increased density/ glandularity in the upper part of both breasts. No axillary, infraclavicular,  or supraclavicular lymphadenopathy. Abdomen: soft, non-tender, non-distended.  Umbilicus is pierced.  No hepatomegaly or  masses palpable. No evidence of hernia  Genitourinary:  External/ BUS: Normal urethral meatus, normal Bartholin's and Skene's glands.    Vagina: no lesions, white thick discharge    Cervix: closed, mobile, NT  Uterus: AF, NSSC, mobile, NT  Adnexa: no adnexal masses, NT  Rectal: deferred  Lymphatic: no evidence of inguinal lymphadenopathy Extremities: no edema, erythema, or tenderness Neurologic: Grossly intact Psychiatric: mood appropriate, affect full  Wet prep: positive for hyphae and negative for Trich and clue cells  Assessment: 19 y.o. G0P0000 well woman exam Monilial vaginitis   Plan:  1) Contraception: condoms/abstinence for now  2) Cervical cancer screening: Pap smear done.   3) STI screening : just had screening in June.   4) Discussed taking vitamin D and calcium supplements. TDAP given today  5) Diflucan 150 mgm prn yeast  symptoms. May repeat in 3 days if still symptomatic  6) RTO 1 year and prn  Farrel Conners, CNM

## 2019-11-14 LAB — CYTOLOGY - PAP
Chlamydia: NEGATIVE
Comment: NEGATIVE
Comment: NORMAL
Diagnosis: NEGATIVE
Neisseria Gonorrhea: NEGATIVE

## 2019-11-15 LAB — POCT WET PREP (WET MOUNT): Trichomonas Wet Prep HPF POC: ABSENT

## 2019-11-19 ENCOUNTER — Encounter: Payer: Self-pay | Admitting: Certified Nurse Midwife

## 2019-12-01 ENCOUNTER — Other Ambulatory Visit: Payer: Managed Care, Other (non HMO)

## 2020-02-09 DIAGNOSIS — A6000 Herpesviral infection of urogenital system, unspecified: Secondary | ICD-10-CM

## 2020-02-09 HISTORY — DX: Herpesviral infection of urogenital system, unspecified: A60.00

## 2020-02-17 NOTE — Progress Notes (Signed)
Krista Nest, NP   Chief Complaint  Patient presents with  . Exposure to STD    HPI:      Krista Flores is a 19 y.o. G0P0000 whose LMP was Patient's last menstrual period was 01/31/2020., presents today for STD testing. No known exposures, no vag d/c or sx, but likes to do it Q3 months. Neg pap/STD testing 8/21; hx of chlamydia in 2018. No hx of HSV or cold sores.  She is sex active, not using BC or condoms.  Also with tear near rectal area. Recently had hard stool and sx started afterwards. Painful, no bleeding.   Past Medical History:  Diagnosis Date  . Anxiety   . Chlamydia 2017-2020   x4  . Chlamydia infection 08/15/2016   Positive Chlamydia on 08/08/2016 and 10/19/2016  . Frequent headaches   . UTI (lower urinary tract infection)     Past Surgical History:  Procedure Laterality Date  . NO PAST SURGERIES      Family History  Problem Relation Age of Onset  . Arthritis Maternal Grandmother   . Stroke Maternal Grandmother   . Hypertension Maternal Grandmother   . Diabetes Maternal Grandmother   . Hypercholesterolemia Maternal Grandmother   . Hypertension Maternal Grandfather   . Hypercholesterolemia Maternal Grandfather   . Other Mother        acid reflux    Social History   Socioeconomic History  . Marital status: Single    Spouse name: Not on file  . Number of children: 0  . Years of education: 38  . Highest education level: Not on file  Occupational History  . 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 Occupation: student    Comment: [employer]  Tobacco Use  . Smoking status: Never Smoker  . Smokeless tobacco: Never Used  Vaping Use  . Vaping Use: Never used  Substance and Sexual Activity  . Alcohol use: No    Alcohol/week: 0.0 standard drinks  . Drug use: Yes    Types: Marijuana    Comment: heavy use  . Sexual activity: Not Currently    Partners: Male    Birth control/protection: None  Other Topics Concern    . Not on file  Social History Narrative   Student at [employer]   She would like to a metrologists.    Enjoys spending time with friends.   Will be starting a job with [employer] in August 2019   Social Determinants of Health   Financial Resource Strain:   . Difficulty of Paying Living Expenses: Not on file  Food Insecurity:   . Worried About Programme researcher, broadcasting/film/video in the Last Year: Not on file  . Ran Out of Food in the Last Year: Not on file  Transportation Needs:   . Lack of Transportation (Medical): Not on file  . Lack of Transportation (Non-Medical): Not on file  Physical Activity:   . Days of Exercise per Week: Not on file  . Minutes of Exercise per Session: Not on file  Stress:   . Feeling of Stress : Not on file  Social Connections:   . Frequency of Communication with Friends and Family: Not on file  . Frequency of Social Gatherings with Friends and Family: Not on file  . Attends Religious Services: Not on file  . Active Member of Clubs or Organizations: Not on file  . Attends Banker Meetings: Not on file  . Marital  Status: Not on file  Intimate Partner Violence:   . Fear of Current or Ex-Partner: Not on file  . Emotionally Abused: Not on file  . Physically Abused: Not on file  . Sexually Abused: Not on file    Outpatient Medications Prior to Visit  Medication Sig Dispense Refill  . Ascorbic Acid (VITAMIN C) 500 MG CHEW Chew 500 mg by mouth daily.    . Calcium-Phosphorus-Vitamin D (CALCIUM/D3 ADULT GUMMIES PO) Take 1 Piece by mouth daily. (Patient not taking: Reported on 02/18/2020)    . fluconazole (DIFLUCAN) 150 MG tablet Take one tablet prn yeast infection symptoms. Can take additional dose three days later if symptoms persist (Patient not taking: Reported on 02/18/2020) 2 tablet 0  . Multiple Vitamin (MULTIVITAMIN WITH MINERALS) TABS tablet Take 1 tablet by mouth daily. (Patient not taking: Reported on 02/18/2020)     No facility-administered medications prior to  visit.      ROS:  Review of Systems  Constitutional: Negative for fever.  Gastrointestinal: Positive for rectal pain. Negative for blood in stool, constipation, diarrhea, nausea and vomiting.  Genitourinary: Negative for dyspareunia, dysuria, flank pain, frequency, hematuria, urgency, vaginal bleeding, vaginal discharge and vaginal pain.  Musculoskeletal: Negative for back pain.  Skin: Negative for rash.   BREAST: No symptoms   OBJECTIVE:   Vitals:  BP 118/74   Ht 5' (1.524 m)   Wt 95 lb (43.1 kg)   LMP 01/31/2020   BMI 18.55 kg/m   Physical Exam Vitals reviewed.  Constitutional:      Appearance: She is well-developed.  Pulmonary:     Effort: Pulmonary effort is normal.  Genitourinary:    Pubic Area: No rash.      Labia:        Right: No rash, tenderness or lesion.        Left: No rash, tenderness or lesion.      Vagina: Normal. No vaginal discharge, erythema or tenderness.     Cervix: Normal.     Uterus: Normal. Not enlarged and not tender.      Adnexa: Right adnexa normal and left adnexa normal.       Right: No mass or tenderness.         Left: No mass or tenderness.      Musculoskeletal:        General: Normal range of motion.     Cervical back: Normal range of motion.  Skin:    General: Skin is warm and dry.  Neurological:     General: No focal deficit present.     Mental Status: She is alert and oriented to person, place, and time.  Psychiatric:        Mood and Affect: Mood normal.        Behavior: Behavior normal.        Thought Content: Thought content normal.        Judgment: Judgment normal.     Assessment/Plan: Vaginal lesion - Plan: HSV NAA, valACYclovir (VALTREX) 1000 MG tablet; Check HSV culture. Rx valtrex. Sitz baths/NSAIDs. Will f/u with results. If neg, will check HSV 2 IgG. HSV etiology, sx, and mgmt discussed.   Screening for STD (sexually transmitted disease) - Plan: HIV Antibody (routine testing w rflx), RPR, Hepatitis C antibody,  Cervicovaginal ancillary only    Meds ordered this encounter  Medications  . valACYclovir (VALTREX) 1000 MG tablet    Sig: Take 1 tablet (1,000 mg total) by mouth 2 (two) times daily for 10 days.  Dispense:  20 tablet    Refill:  0    Order Specific Question:   Supervising Provider    Answer:   Nadara Mustard [[id number]]      Return if symptoms worsen or fail to improve.  Lashea Goda B. Johnni Wunschel, PA-C 02/18/2020 3:02 PM

## 2020-02-17 NOTE — Patient Instructions (Signed)
I value your feedback and entrusting us with your care. If you get a [facility] patient survey, I would appreciate you taking the time to let us know about your experience today. Thank you!  As of March 20, 2019, your lab results will be released to your MyChart immediately, before I even have a chance to see them. Please give me time to review them and contact you if there are any abnormalities. Thank you for your patience.  

## 2020-02-18 ENCOUNTER — Other Ambulatory Visit: Payer: Self-pay

## 2020-02-18 ENCOUNTER — Encounter: Payer: Self-pay | Admitting: Obstetrics and Gynecology

## 2020-02-18 ENCOUNTER — Ambulatory Visit (INDEPENDENT_AMBULATORY_CARE_PROVIDER_SITE_OTHER): Payer: Managed Care, Other (non HMO) | Admitting: Obstetrics and Gynecology

## 2020-02-18 ENCOUNTER — Other Ambulatory Visit (HOSPITAL_COMMUNITY)
Admission: RE | Admit: 2020-02-18 | Discharge: 2020-02-18 | Disposition: A | Discharge status: Home / Self Care | Payer: Managed Care, Other (non HMO) | Source: Ambulatory Visit | Attending: Obstetrics and Gynecology | Admitting: Obstetrics and Gynecology

## 2020-02-18 VITALS — BP 118/74 | Ht 60.0 in | Wt 95.0 lb

## 2020-02-18 DIAGNOSIS — Z113 Encounter for screening for infections with a predominantly sexual mode of transmission: Secondary | ICD-10-CM | POA: Diagnosis not present

## 2020-02-18 DIAGNOSIS — N898 Other specified noninflammatory disorders of vagina: Secondary | ICD-10-CM | POA: Diagnosis not present

## 2020-02-18 MED ORDER — VALACYCLOVIR HCL 1 G PO TABS: 1000.0000 mg | ORAL_TABLET | Freq: Two times a day (BID) | ORAL | 0 refills | Status: DC

## 2020-02-19 LAB — CERVICOVAGINAL ANCILLARY ONLY
Chlamydia: NEGATIVE
Comment: NEGATIVE
Comment: NEGATIVE
Comment: NORMAL
Neisseria Gonorrhea: NEGATIVE
Trichomonas: NEGATIVE

## 2020-02-19 LAB — SYPHILIS: RPR W/REFLEX TO RPR TITER AND TREPONEMAL ANTIBODIES, TRADITIONAL SCREENING AND DIAGNOSIS ALGORITHM: RPR Ser Ql: NONREACTIVE

## 2020-02-19 LAB — HEPATITIS C ANTIBODY: Hep C Virus Ab: <0.1 s/co ratio (ref 0.0–0.9)

## 2020-02-19 LAB — HIV ANTIBODY (ROUTINE TESTING W REFLEX): HIV Screen 4th Generation wRfx: NONREACTIVE

## 2020-02-24 ENCOUNTER — Encounter: Payer: Self-pay | Admitting: Obstetrics and Gynecology

## 2020-02-24 LAB — HSV NAA
HSV 1 NAA: NEGATIVE
HSV 2 NAA: POSITIVE — AB

## 2020-02-24 MED ORDER — VALACYCLOVIR HCL 500 MG PO TABS: 500.0000 mg | ORAL_TABLET | Freq: Every day | ORAL | 1 refills | Status: DC

## 2020-06-22 ENCOUNTER — Other Ambulatory Visit: Payer: Self-pay

## 2020-06-22 ENCOUNTER — Ambulatory Visit (INDEPENDENT_AMBULATORY_CARE_PROVIDER_SITE_OTHER): Payer: Managed Care, Other (non HMO) | Admitting: Obstetrics and Gynecology

## 2020-06-22 ENCOUNTER — Encounter: Payer: Self-pay | Admitting: Obstetrics and Gynecology

## 2020-06-22 ENCOUNTER — Other Ambulatory Visit (HOSPITAL_COMMUNITY)
Admission: RE | Admit: 2020-06-22 | Discharge: 2020-06-22 | Disposition: A | Discharge status: Home / Self Care | Payer: Managed Care, Other (non HMO) | Source: Ambulatory Visit | Attending: Obstetrics and Gynecology | Admitting: Obstetrics and Gynecology

## 2020-06-22 VITALS — BP 100/60 | Ht 60.0 in | Wt 97.0 lb

## 2020-06-22 DIAGNOSIS — A6004 Herpesviral vulvovaginitis: Secondary | ICD-10-CM | POA: Diagnosis not present

## 2020-06-22 DIAGNOSIS — Z113 Encounter for screening for infections with a predominantly sexual mode of transmission: Secondary | ICD-10-CM | POA: Diagnosis present

## 2020-06-22 NOTE — Addendum Note (Signed)
Addended by: Althea Grimmer B on: 06/22/2020 03:00 PM   Modules accepted: Orders

## 2020-06-22 NOTE — Patient Instructions (Signed)
I value your feedback and you entrusting us with your care. If you get a [facility] patient survey, I would appreciate you taking the time to let us know about your experience today. Thank you! ? ? ?

## 2020-06-22 NOTE — Progress Notes (Signed)
Krista Nest, NP   Chief Complaint  Patient presents with  . STD testing    HPI:      [name] is a 20 y.o. G0P0000 whose LMP was Patient's last menstrual period was 06/02/2020 (approximate)., presents today for her routine STD testing. Pt denies any sx or known exposures but wants to be safe. Had  Neg gon/chlam testing 11/21. 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 Pt has a new sex partner, sometimes using condoms.  Hx of chlamydia in past. Hx of BV in past, had similar sx a few wks ago after sex and treated with leftover flagyl with sx relief. No vag sx today.   Past Medical History:  Diagnosis Date  . Anxiety   . BV (bacterial vaginosis)   . Chlamydia 2017-2020   x4  . Chlamydia infection 08/15/2016   Positive Chlamydia on 08/08/2016 and 10/19/2016  . Frequent headaches   . Genital herpes 02/2020   type 2 on culture  . UTI (lower urinary tract infection)     Past Surgical History:  Procedure Laterality Date  . NO PAST SURGERIES      Family History  Problem Relation Age of Onset  . Arthritis Maternal Grandmother   . Stroke Maternal Grandmother   . Hypertension Maternal Grandmother   . Diabetes Maternal Grandmother   . Hypercholesterolemia Maternal Grandmother   . Hypertension Maternal Grandfather   . Hypercholesterolemia Maternal Grandfather   . Other Mother        acid reflux    Social History   Socioeconomic History  . Marital status: Single    Spouse name: Not on file  . Number of children: 0  . Years of education: 21  . Highest education level: Not on file  Occupational History  . 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 Occupation: student    Comment: [employer]  Tobacco Use  . Smoking status: Never Smoker  . Smokeless tobacco: Never Used  Vaping Use  . Vaping Use: Never used  Substance and Sexual Activity  . Alcohol use: No     Alcohol/week: 0.0 standard drinks  . Drug use: Yes    Types: Marijuana    Comment: heavy use  . Sexual activity: Yes    Partners: Male    Birth control/protection: None  Other Topics Concern  . Not on file  Social History Narrative   Student at [employer]   She would like to a metrologists.    Enjoys spending time with friends.   Will be starting a job with [employer] in August 2019   Social Determinants of Health   Financial Resource Strain: Not on file  Food Insecurity: Not on file  Transportation Needs: Not on file  Physical Activity: Not on file  Stress: Not on file  Social Connections: Not on file  Intimate Partner Violence: Not on file    Outpatient Medications Prior to Visit  Medication Sig Dispense Refill  . Ascorbic Acid (VITAMIN C) 500 MG CHEW Chew 500 mg by mouth daily.    . Multiple Vitamin (MULTIVITAMIN WITH MINERALS) TABS tablet Take 1 tablet by mouth daily.    . valACYclovir (VALTREX) 500 MG tablet Take 1 tablet (500 mg total) by mouth daily. 90 tablet 1  . Calcium-Phosphorus-Vitamin D (CALCIUM/D3 ADULT GUMMIES PO) Take 1 Piece by mouth daily. (Patient not taking: Reported on  02/18/2020)    . fluconazole (DIFLUCAN) 150 MG tablet Take one tablet prn yeast infection symptoms. Can take additional dose three days later if symptoms persist (Patient not taking: Reported on 02/18/2020) 2 tablet 0   No facility-administered medications prior to visit.      ROS:  Review of Systems  Constitutional: Negative for fever.  Gastrointestinal: Negative for blood in stool, constipation, diarrhea, nausea and vomiting.  Genitourinary: Negative for dyspareunia, dysuria, flank pain, frequency, hematuria, urgency, vaginal bleeding, vaginal discharge and vaginal pain.  Musculoskeletal: Negative for back pain.  Skin: Negative for rash.    OBJECTIVE:   Vitals:  BP 100/60   Ht 5' (1.524 m)   Wt 97 lb (44 kg)   LMP 06/02/2020 (Approximate)   BMI 18.94 kg/m   Physical Exam Vitals  reviewed.  Constitutional:      Appearance: She is well-developed.  Pulmonary:     Effort: Pulmonary effort is normal.  Genitourinary:    General: Normal vulva.     Pubic Area: No rash.      Labia:        Right: No rash, tenderness or lesion.        Left: No rash, tenderness or lesion.      Vagina: Normal. No vaginal discharge, erythema or tenderness.     Cervix: Normal.     Uterus: Normal. Not enlarged and not tender.      Adnexa: Right adnexa normal and left adnexa normal.       Right: No mass or tenderness.         Left: No mass or tenderness.    Musculoskeletal:        General: Normal range of motion.     Cervical back: Normal range of motion.  Skin:    General: Skin is warm and dry.  Neurological:     General: No focal deficit present.     Mental Status: She is alert and oriented to person, place, and time.  Psychiatric:        Mood and Affect: Mood normal.        Behavior: Behavior normal.        Thought Content: Thought content normal.        Judgment: Judgment normal.     Assessment/Plan: Screening for STD (sexually transmitted disease) - Plan: HIV Antibody (routine testing w rflx), RPR, Hepatitis C antibody  Herpes simplex vulvovaginitis-- pt doing well with valtrex daily.    Return if symptoms worsen or fail to improve.  Truong Delcastillo B. Chitara Clonch, PA-C 06/22/2020 2:54 PM

## 2020-06-23 ENCOUNTER — Encounter: Payer: Self-pay | Admitting: Primary Care

## 2020-06-23 ENCOUNTER — Ambulatory Visit (INDEPENDENT_AMBULATORY_CARE_PROVIDER_SITE_OTHER): Payer: Managed Care, Other (non HMO) | Admitting: Primary Care

## 2020-06-23 ENCOUNTER — Ambulatory Visit (INDEPENDENT_AMBULATORY_CARE_PROVIDER_SITE_OTHER)
Admission: RE | Admit: 2020-06-23 | Discharge: 2020-06-23 | Disposition: A | Discharge status: Home / Self Care | Payer: Managed Care, Other (non HMO) | Source: Ambulatory Visit | Attending: Primary Care | Admitting: Primary Care

## 2020-06-23 VITALS — BP 118/72 | HR 62 | Temp 98.6°F | Ht 60.0 in | Wt 98.0 lb

## 2020-06-23 DIAGNOSIS — R053 Chronic cough: Secondary | ICD-10-CM | POA: Insufficient documentation

## 2020-06-23 DIAGNOSIS — Z0000 Encounter for general adult medical examination without abnormal findings: Secondary | ICD-10-CM | POA: Diagnosis not present

## 2020-06-23 DIAGNOSIS — N926 Irregular menstruation, unspecified: Secondary | ICD-10-CM

## 2020-06-23 HISTORY — DX: Chronic cough: R05.3

## 2020-06-23 LAB — RPR: RPR Ser Ql: NONREACTIVE

## 2020-06-23 LAB — HEPATITIS C ANTIBODY: Hep C Virus Ab: <0.1 s/co ratio (ref 0.0–0.9)

## 2020-06-23 LAB — HIV ANTIBODY (ROUTINE TESTING W REFLEX): HIV Screen 4th Generation wRfx: NONREACTIVE

## 2020-06-23 LAB — POCT URINE PREGNANCY: Preg Test, Ur: NEGATIVE

## 2020-06-23 NOTE — Progress Notes (Signed)
Subjective:    Patient ID: Krista Flores, female    DOB: September 14, 2000, 20 y.o.   MRN: [id number]  HPI  Krista Flores is a very pleasant 20 y.o. female who presents today with a chief complaint of cough.  Her cough began around late 2021, occurs numerous times daily. One day last week she became worried when she gasped during a cough, this has not occurred since. Cough is mostly dry, sometimes congested but never able to cough up mucous.   She is a smoker of marijuana and is smoking 2 blunts daily. She does notice post nasal drip at times.   She denies a history of asthma, esophageal burning, hemoptysis, shortness of breath, unexplained weight loss. She does not smoke cigarettes, does use the cigarillo wrappers for her marijuana.     Review of Systems  Constitutional: Negative for appetite change and unexpected weight change.  HENT: Positive for postnasal drip. Negative for congestion.   Respiratory: Positive for cough. Negative for shortness of breath.          Past Medical History:  Diagnosis Date  . Anxiety   . BV (bacterial vaginosis)   . Chlamydia 2017-2020   x4  . Chlamydia infection 08/15/2016   Positive Chlamydia on 08/08/2016 and 10/19/2016  . Frequent headaches   . Genital herpes 02/2020   type 2 on culture  . UTI (lower urinary tract infection)     Social History   Socioeconomic History  . Marital status: Single    Spouse name: Not on file  . Number of children: 0  . Years of education: 14  . Highest education level: Not on file  Occupational History  . 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 Occupation: student    Comment: [employer]  Tobacco Use  . Smoking status: Never Smoker  . Smokeless tobacco: Never Used  Vaping Use  . Vaping Use: Never used  Substance and Sexual Activity  . Alcohol use: No    Alcohol/week: 0.0 standard drinks  . Drug use: Yes    Types: Marijuana    Comment: heavy use  . Sexual activity: Yes     Partners: Male    Birth control/protection: None  Other Topics Concern  . Not on file  Social History Narrative   Student at [employer]   She would like to a metrologists.    Enjoys spending time with friends.   Will be starting a job with [employer] in August 2019   Social Determinants of Health   Financial Resource Strain: Not on file  Food Insecurity: Not on file  Transportation Needs: Not on file  Physical Activity: Not on file  Stress: Not on file  Social Connections: Not on file  Intimate Partner Violence: Not on file    Past Surgical History:  Procedure Laterality Date  . NO PAST SURGERIES      Family History  Problem Relation Age of Onset  . Arthritis Maternal Grandmother   . Stroke Maternal Grandmother   . Hypertension Maternal Grandmother   . Diabetes Maternal Grandmother   . Hypercholesterolemia Maternal Grandmother   . Hypertension Maternal Grandfather   . Hypercholesterolemia Maternal Grandfather   . Other Mother        acid reflux    Allergies  Allergen Reactions  . Penicillins Hives    Current Outpatient Medications on File Prior to Visit  Medication Sig Dispense Refill  . Ascorbic Acid (VITAMIN C) 500 MG CHEW  Chew 500 mg by mouth daily.    . Multiple Vitamin (MULTIVITAMIN WITH MINERALS) TABS tablet Take 1 tablet by mouth daily.     No current facility-administered medications on file prior to visit.    LMP 06/02/2020 (Approximate)  Objective:   Physical Exam Constitutional:      General: She is not in acute distress.    Appearance: She is not ill-appearing.  Cardiovascular:     Rate and Rhythm: Normal rate and regular rhythm.  Pulmonary:     Effort: Pulmonary effort is normal.     Breath sounds: Normal breath sounds. No wheezing.  Musculoskeletal:     Cervical back: Neck supple.  Skin:    General: Skin is warm and dry.           Assessment & Plan:      This visit occurred during the SARS-CoV-2 public health emergency.  Safety protocols  were in place, including screening questions prior to the visit, additional usage of staff PPE, and extensive cleaning of exam room while observing appropriate contact time as indicated for disinfecting solutions.

## 2020-06-23 NOTE — Addendum Note (Signed)
Addended by: Aquilla Solian on: 06/23/2020 02:20 PM   Modules accepted: Orders

## 2020-06-23 NOTE — Patient Instructions (Signed)
Your cough could be secondary to smoking, allergies, or heartburn.  Try to stop smoking.  You can try taking Zyrtec 10 mg at bedtime for the next 2-3 weeks, this could help with cough if you have allergies.  If no improvement then try famotidine 20 mg once daily for heartburn. Heartburn is another common cause for cough.   Please update me via my chart if no improvement.  It was a pleasure to see you today!

## 2020-06-23 NOTE — Assessment & Plan Note (Signed)
Chronic for 3 months, lungs clear on exam.  Differentials include marijuana abuse, allergies, silent reflux. Discussed this with patient today.  Checking plain films today. Discussed to stop smoking. Also discussed use of Zyrtec x 2-3 weeks, or famotidine 20 mg x 2-3 weeks.   She will update.

## 2020-06-24 LAB — CERVICOVAGINAL ANCILLARY ONLY
Chlamydia: NEGATIVE
Comment: NEGATIVE
Comment: NEGATIVE
Comment: NORMAL
Neisseria Gonorrhea: NEGATIVE
Trichomonas: NEGATIVE

## 2020-07-19 DIAGNOSIS — L74510 Primary focal hyperhidrosis, axilla: Secondary | ICD-10-CM

## 2020-07-20 MED ORDER — GLYCOPYRROLATE 1 MG PO TABS: ORAL_TABLET | ORAL | 0 refills | Status: DC

## 2020-08-13 ENCOUNTER — Encounter: Payer: Managed Care, Other (non HMO) | Admitting: Advanced Practice Midwife

## 2020-08-17 ENCOUNTER — Other Ambulatory Visit: Payer: Self-pay | Admitting: Obstetrics and Gynecology

## 2020-08-17 ENCOUNTER — Other Ambulatory Visit: Payer: Self-pay | Admitting: Primary Care

## 2020-08-17 DIAGNOSIS — L74510 Primary focal hyperhidrosis, axilla: Secondary | ICD-10-CM

## 2020-08-18 NOTE — Telephone Encounter (Signed)
Is she actually taking this for her sweating? Or was this an autofill request?

## 2020-08-19 NOTE — Telephone Encounter (Signed)
Called patient did not request refill

## 2020-09-13 NOTE — Progress Notes (Signed)
[name], NP   Chief Complaint  Patient presents with  . Vaginal Discharge    Sour odor, no itchiness or irritation since Friday    HPI:      [name] is a 20 y.o. G0P0000 whose LMP was No LMP recorded. (Menstrual status: Other)., presents today for vaginal d/c with sour odor for several days, no irritation/itching. D/c has been pink/bloody with wiping only. S/p EAB with pills 08/12/20 at [facility]. Started xulane patch when bleeding stopped. Had neg UPT 09/09/20. No LBP, pelvic pain, fevers, urin sx.  She is sex active, no new partners.  Hx of BV, treated with flagyl 3/22  Past Medical History:  Diagnosis Date  . Anxiety   . BV (bacterial vaginosis)   . Chlamydia 2017-2020   x4  . Chlamydia infection 08/15/2016   Positive Chlamydia on 08/08/2016 and 10/19/2016  . Frequent headaches   . Genital herpes 02/2020   type 2 on culture  . UTI (lower urinary tract infection)     Past Surgical History:  Procedure Laterality Date  . NO PAST SURGERIES      Family History  Problem Relation Age of Onset  . Arthritis Maternal Grandmother   . Stroke Maternal Grandmother   . Hypertension Maternal Grandmother   . Diabetes Maternal Grandmother   . Hypercholesterolemia Maternal Grandmother   . Hypertension Maternal Grandfather   . Hypercholesterolemia Maternal Grandfather   . Other Mother        acid reflux    Social History   Socioeconomic History  . Marital status: Single    Spouse name: Not on file  . Number of children: 0  . Years of education: 12  . Highest education level: Not on file  Occupational History  . 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 Occupation: student    Comment: [employer]  Tobacco Use  . Smoking status: Never Smoker  . Smokeless tobacco: Never Used  Vaping Use  . Vaping Use: Never used  Substance and Sexual Activity  . Alcohol use: No    Alcohol/week: 0.0 standard drinks  . Drug use: Yes     Types: Marijuana    Comment: heavy use  . Sexual activity: Yes    Partners: Male    Birth control/protection: Patch  Other Topics Concern  . Not on file  Social History Narrative   Student at [employer]   She would like to a metrologists.    Enjoys spending time with friends.   Will be starting a job with [employer] in August 2019   Social Determinants of Health   Financial Resource Strain: Not on file  Food Insecurity: Not on file  Transportation Needs: Not on file  Physical Activity: Not on file  Stress: Not on file  Social Connections: Not on file  Intimate Partner Violence: Not on file    Outpatient Medications Prior to Visit  Medication Sig Dispense Refill  . Ascorbic Acid (VITAMIN C) 500 MG CHEW Chew 500 mg by mouth daily.    Marland Kitchen glycopyrrolate (ROBINUL) 1 MG tablet Take 1 tablet by mouth once daily for excessive sweating. 30 tablet 0  . Multiple Vitamin (MULTIVITAMIN WITH MINERALS) TABS tablet Take 1 tablet by mouth daily.    . valACYclovir (VALTREX) 500 MG tablet TAKE 1 TABLET BY MOUTH EVERY DAY 90 tablet 0  . XULANE 150-35 MCG/24HR transdermal patch 1 patch once a week.     No facility-administered  medications prior to visit.      ROS:  Review of Systems  Constitutional: Negative for fever.  Gastrointestinal: Negative for blood in stool, constipation, diarrhea, nausea and vomiting.  Genitourinary: Positive for vaginal bleeding. Negative for dyspareunia, dysuria, flank pain, frequency, hematuria, urgency, vaginal discharge and vaginal pain.  Musculoskeletal: Negative for back pain.  Skin: Negative for rash.   BREAST: No symptoms   OBJECTIVE:   Vitals:  BP 110/70   Ht 5' (1.524 m)   Wt 93 lb (42.2 kg)   BMI 18.16 kg/m   Physical Exam Vitals reviewed.  Constitutional:      Appearance: She is well-developed.  Pulmonary:     Effort: Pulmonary effort is normal.  Genitourinary:    General: Normal vulva.     Pubic Area: No rash.      Labia:        Right: No rash,  tenderness or lesion.        Left: No rash, tenderness or lesion.      Vagina: Normal. No vaginal discharge, erythema or tenderness.     Cervix: Normal.     Uterus: Normal. Not enlarged and not tender.      Adnexa: Right adnexa normal and left adnexa normal.       Right: No mass or tenderness.         Left: No mass or tenderness.       Comments: SCANT AMT PINK D/C ON EXAM Musculoskeletal:        General: Normal range of motion.     Cervical back: Normal range of motion.  Skin:    General: Skin is warm and dry.  Neurological:     General: No focal deficit present.     Mental Status: She is alert and oriented to person, place, and time.  Psychiatric:        Mood and Affect: Mood normal.        Behavior: Behavior normal.        Thought Content: Thought content normal.        Judgment: Judgment normal.     Results: Results for orders placed or performed in visit on 09/14/20 (from the past 24 hour(s))  POCT Wet Prep with KOH     Status: Abnormal   Collection Time: 09/14/20  1:58 PM  Result Value Ref Range   Trichomonas, UA Negative    Clue Cells Wet Prep HPF POC pos    Epithelial Wet Prep HPF POC     Yeast Wet Prep HPF POC neg    Bacteria Wet Prep HPF POC     RBC Wet Prep HPF POC     WBC Wet Prep HPF POC     KOH Prep POC Positive (A) Negative     Assessment/Plan: BV (bacterial vaginosis) - Plan: clindamycin (CLEOCIN) 300 MG capsule, POCT Wet Prep with KOH; pos sx and wet prep. Rx clindamycin (pt going on vacation next wk and will want to have alcohol for her bday). F/u prn.   Breakthrough bleeding on contraceptive patch--neg UPT last wk. Reassurance with new start BC. F/u prn.    Meds ordered this encounter  Medications  . clindamycin (CLEOCIN) 300 MG capsule    Sig: Take 1 capsule (300 mg total) by mouth 2 (two) times daily for 7 days.    Dispense:  14 capsule    Refill:  0    Order Specific Question:   Supervising Provider    Answer:   Nadara Mustard [  [id number]]       Return if symptoms worsen or fail to improve.  Owens Hara B. Nyellie Yetter, PA-C 09/14/2020 2:03 PM

## 2020-09-14 ENCOUNTER — Encounter: Payer: Self-pay | Admitting: Obstetrics and Gynecology

## 2020-09-14 ENCOUNTER — Other Ambulatory Visit: Payer: Self-pay

## 2020-09-14 ENCOUNTER — Ambulatory Visit (INDEPENDENT_AMBULATORY_CARE_PROVIDER_SITE_OTHER): Payer: Managed Care, Other (non HMO) | Admitting: Obstetrics and Gynecology

## 2020-09-14 VITALS — BP 110/70 | Ht 60.0 in | Wt 93.0 lb

## 2020-09-14 DIAGNOSIS — B9689 Other specified bacterial agents as the cause of diseases classified elsewhere: Secondary | ICD-10-CM

## 2020-09-14 DIAGNOSIS — N921 Excessive and frequent menstruation with irregular cycle: Secondary | ICD-10-CM

## 2020-09-14 DIAGNOSIS — N760 Acute vaginitis: Secondary | ICD-10-CM

## 2020-09-14 LAB — POCT WET PREP WITH KOH
Clue Cells Wet Prep HPF POC: POSITIVE
KOH Prep POC: POSITIVE — AB
Trichomonas, UA: NEGATIVE
Yeast Wet Prep HPF POC: NEGATIVE

## 2020-09-14 MED ORDER — CLINDAMYCIN HCL 300 MG PO CAPS: 300.0000 mg | ORAL_CAPSULE | Freq: Two times a day (BID) | ORAL | 0 refills | Status: AC

## 2020-09-14 NOTE — Patient Instructions (Signed)
I value your feedback and you entrusting us with your care. If you get a [facility] patient survey, I would appreciate you taking the time to let us know about your experience today. Thank you! ? ? ?

## 2020-10-31 ENCOUNTER — Encounter: Payer: Self-pay | Admitting: Obstetrics and Gynecology

## 2020-11-01 ENCOUNTER — Other Ambulatory Visit: Payer: Self-pay

## 2020-11-01 MED ORDER — XULANE 150-35 MCG/24HR TD PTWK: 1.0000 | MEDICATED_PATCH | TRANSDERMAL | 0 refills | Status: DC

## 2020-11-12 ENCOUNTER — Other Ambulatory Visit: Payer: Self-pay | Admitting: Obstetrics and Gynecology

## 2020-12-01 ENCOUNTER — Encounter: Payer: Self-pay | Admitting: Obstetrics

## 2020-12-01 ENCOUNTER — Other Ambulatory Visit (HOSPITAL_COMMUNITY)
Admission: RE | Admit: 2020-12-01 | Discharge: 2020-12-01 | Disposition: A | Discharge status: Home / Self Care | Payer: Managed Care, Other (non HMO) | Source: Ambulatory Visit | Attending: Obstetrics | Admitting: Obstetrics

## 2020-12-01 ENCOUNTER — Ambulatory Visit (INDEPENDENT_AMBULATORY_CARE_PROVIDER_SITE_OTHER): Payer: Managed Care, Other (non HMO) | Admitting: Obstetrics

## 2020-12-01 ENCOUNTER — Other Ambulatory Visit: Payer: Self-pay

## 2020-12-01 VITALS — BP 102/60 | Ht 60.0 in | Wt 90.0 lb

## 2020-12-01 DIAGNOSIS — Z3040 Encounter for surveillance of contraceptives, unspecified: Secondary | ICD-10-CM | POA: Diagnosis not present

## 2020-12-01 DIAGNOSIS — Z113 Encounter for screening for infections with a predominantly sexual mode of transmission: Secondary | ICD-10-CM

## 2020-12-01 DIAGNOSIS — Z01419 Encounter for gynecological examination (general) (routine) without abnormal findings: Secondary | ICD-10-CM

## 2020-12-01 DIAGNOSIS — Z124 Encounter for screening for malignant neoplasm of cervix: Secondary | ICD-10-CM | POA: Diagnosis not present

## 2020-12-01 DIAGNOSIS — A6004 Herpesviral vulvovaginitis: Secondary | ICD-10-CM

## 2020-12-01 MED ORDER — VALACYCLOVIR HCL 500 MG PO TABS: 500.0000 mg | ORAL_TABLET | Freq: Every day | ORAL | 3 refills | Status: DC

## 2020-12-01 MED ORDER — XULANE 150-35 MCG/24HR TD PTWK: 1.0000 | MEDICATED_PATCH | TRANSDERMAL | 4 refills | Status: DC

## 2020-12-01 NOTE — Progress Notes (Signed)
Gynecology Annual Exam  PCP: [name], NP  Chief Complaint:  Chief Complaint  Patient presents with   Annual Exam    History of Present Illness:  [name] is a 20 y.o. G0P0000 who works part time at [employer], and also is in Therapist, nutritional IT.Her  last  LMP was Patient's last menstrual period was 12/01/2020 (exact date)., presents today for her annual examination.  Her menses are regular every 28-30 days, lasting 3 day(s).  Dysmenorrhea none. She does not have intermenstrual bleeding. She shares that she had an elective abortion in May of this year.  She is multiple partners, contraception - Ortho-Evra patches weekly.  Last Pap: not done due to age   Hx of STDs: chlamydia, HSV  There is no FH of breast cancer. There is no FH of ovarian cancer. The patient does not do self-breast exams.  Tobacco use: The patient denies current or previous tobacco use. Alcohol use: none Exercise: moderately active    The patient wears seatbelts: yes.   The patient reports that domestic violence in her life is absent.   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 Authorizing Provider  Ascorbic Acid (VITAMIN C) 500 MG CHEW Chew 500 mg by mouth daily.   Yes [provider]  glycopyrrolate (ROBINUL) 1 MG tablet Take 1 tablet by mouth once daily for excessive sweating. 07/20/20  Yes [name], NP  Multiple Vitamin (MULTIVITAMIN WITH MINERALS) TABS tablet Take 1 tablet by mouth daily.   Yes [provider]  valACYclovir (VALTREX) 500 MG  tablet TAKE 1 TABLET BY MOUTH EVERY DAY 11/12/20  Yes Copland, Ilona Sorrel, PA-C  XULANE 150-35 MCG/24HR transdermal patch Place 1 patch onto the skin once a week. 11/01/20  Yes Copland, Ilona Sorrel, PA-C    Allergies  Allergen Reactions   Penicillins Hives    Gynecologic History: Patient's last menstrual period was 12/01/2020 (exact date). 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    Enjoys  spending time with friends.   Will be starting a job with [employer] in August 2019   Social Determinants of Health   Financial Resource Strain: Not on file  Food Insecurity: Not on file  Transportation Needs: Not on file  Physical Activity: Not on file  Stress: Not on file  Social Connections: Not on file  Intimate Partner Violence: Not on file    Family History  Problem Relation Age of Onset   Arthritis Maternal Grandmother    Stroke Maternal Grandmother    Hypertension Maternal Grandmother    Diabetes Maternal Grandmother    Hypercholesterolemia Maternal Grandmother    Hypertension Maternal Grandfather    Hypercholesterolemia Maternal Grandfather    Other Mother        acid reflux    ROS    Physical Exam BP 102/60   Ht 5' (1.524 m)   Wt 90 lb (40.8 kg)   LMP 12/01/2020 (Exact Date)   BMI 17.58 kg/m    OBGyn Exam  Female chaperone present for pelvic and breast  portions of the physical exam  Results:  PHQ-9: NA   Assessment: 20 y.o. G0P0000 female here for routine annual gynecologic examination Desires STI screening for GC, CT. Has had HIV, RPR and Hep B recently .  Plan: Problem List Items Addressed This Visit   None Visit Diagnoses     Women's annual routine gynecological examination    -  Primary   Cervical cancer screening           Screening: -- Blood pressure screen normal -- Weight screening: normal -- Depression screening negative (PHQ-9) -- Nutrition: normal -- cholesterol screening: not due for screening -- osteoporosis screening: not due -- tobacco screening: not using -- alcohol screening: AUDIT questionnaire indicates low-risk usage. -- family history of breast cancer screening: done. not at high risk. -- no evidence of domestic violence or intimate partner violence. -- STD screening: gonorrhea/chlamydia NAAT collected -- pap smear collected per ASCCP guidelines -- flu vaccine  declines -- HPV vaccination series:  uncertain regarding whether she has received this. Renewed her Xulane patches for another years. Discussed STI risks and condom use. She was recenlty diagnosed with HSV 2, and takes a daily Valtrex. I have refilled this RX.   Mirna Mires, CNM  12/01/2020 1:10 PM   12/01/2020 10:54 AM

## 2020-12-03 ENCOUNTER — Encounter: Payer: Self-pay | Admitting: Obstetrics

## 2020-12-03 LAB — CYTOLOGY - PAP: Diagnosis: NEGATIVE

## 2020-12-05 LAB — GC/CHLAMYDIA PROBE AMP
Chlamydia trachomatis, NAA: NEGATIVE
Neisseria Gonorrhoeae by PCR: NEGATIVE

## 2021-02-01 ENCOUNTER — Ambulatory Visit (INDEPENDENT_AMBULATORY_CARE_PROVIDER_SITE_OTHER): Payer: Managed Care, Other (non HMO) | Admitting: Family Medicine

## 2021-02-01 ENCOUNTER — Telehealth: Payer: Self-pay

## 2021-02-01 ENCOUNTER — Other Ambulatory Visit: Payer: Self-pay

## 2021-02-01 ENCOUNTER — Encounter: Payer: Self-pay | Admitting: Family Medicine

## 2021-02-01 VITALS — BP 90/60 | HR 87 | Temp 98.4°F | Ht 60.0 in | Wt 86.2 lb

## 2021-02-01 DIAGNOSIS — R636 Underweight: Secondary | ICD-10-CM | POA: Diagnosis not present

## 2021-02-01 DIAGNOSIS — A084 Viral intestinal infection, unspecified: Secondary | ICD-10-CM | POA: Diagnosis not present

## 2021-02-01 HISTORY — DX: Viral intestinal infection, unspecified: A08.4

## 2021-02-01 NOTE — Telephone Encounter (Signed)
I spoke with pts mom (DPR signed) pt was in shower. Pt started on 01/30/21 with runny nose and 01/31/21 N&V started and pt last vomited on 01/31/21 in the evening. No vomiting this morning.pt has not had any diarrhea, no fever, no S/T and no dizziness. Pt had neg home covid test on 01/31/21.pts mom said that pt plans to be at [facility] today at 10:40. Lupita Leash CMA notified by phone and Lupita Leash said OK for pt to come in front entrance wearing a mask. Pts mom voiced understanding. Sending note to [facility] CMA.

## 2021-02-01 NOTE — Assessment & Plan Note (Signed)
Discussed possible causes. She eats very little.  Per pt work up in past, some moodiness/mild dysphoria but denies body dysmorphia feelings.  Could not afford nutritionist. Encouraged her to eat larger meals with p[rotein more frequently, stomach likely shrunken over time.  Recommend follow up with PCP top address further. Consider repeat testing, mood eval and possibly medication like remeron.

## 2021-02-01 NOTE — Assessment & Plan Note (Signed)
Symptomatic care. Advance fluids and diet as tolerated. Prn promethazine .  Pt reassured. Unlikely stomach cancer.

## 2021-02-01 NOTE — Progress Notes (Signed)
Patient ID: Krista Flores, female    DOB: 06-08-2000, 20 y.o.   MRN: [id number]  This visit was conducted in person.  BP 90/60   Pulse 87   Temp 98.4 F (36.9 C) (Temporal)   Ht 5' (1.524 m)   Wt 86 lb 4 oz (39.1 kg)   LMP 01/30/2021   SpO2 99%   BMI 16.84 kg/m    CC:  Chief Complaint  Patient presents with   Nasal Congestion    Started yesterday with clear mucus-Negative Covid test yesterday   Emesis    Yesterday x 8 times    Subjective:   HPI: Krista Flores is a 20 y.o. female presenting on 02/01/2021 for Nasal Congestion (Started yesterday with clear mucus-Negative Covid test yesterday) and Emesis (Yesterday x 8 times)   Date of onset of symptoms: 01/30/2021  She reports new onset  feeling of increased need to  swallow.  Yesterday started with throwing up and nausea, several times.  Abdominal pain started after emesis  She could not  eat because it triggered cause emesis.  In last 12 hours she has had about 8 oz fluids.  Watery BM this AM. She states she feels much better this AM. No further emesis, no abdominal pain today.  She still has decreased appetite... this is also a chronic issue for her.    Some nasal congestion yesterday, minimal now. No cough, no fever, no myalgia. No ST.  " My Mom thinks I have stomach cancer"   COVID 19 screen COVID testing:negative 10/24 COVID vaccine: 10/29/19 COVID exposure: No recent travel or known exposure to COVID19  The importance of social distancing was discussed today.   Outpatient Medications Prior to Visit  Medication Sig Dispense Refill   Ascorbic Acid (VITAMIN C) 500 MG CHEW Chew 500 mg by mouth daily.     Multiple Vitamin (MULTIVITAMIN WITH MINERALS) TABS tablet Take 1 tablet by mouth daily.     valACYclovir (VALTREX) 500 MG tablet Take 1 tablet (500 mg total) by mouth daily. 90 tablet 3   XULANE 150-35 MCG/24HR transdermal patch Place 1 patch onto the skin once a week. 3 patch 4   glycopyrrolate  (ROBINUL) 1 MG tablet Take 1 tablet by mouth once daily for excessive sweating. 30 tablet 0   No facility-administered medications prior to visit.     Per HPI unless specifically indicated in ROS section below Review of Systems  Constitutional:  Negative for fatigue and fever.  HENT:  Negative for ear pain.   Eyes:  Negative for pain.  Respiratory:  Negative for chest tightness and shortness of breath.   Cardiovascular:  Negative for chest pain, palpitations and leg swelling.  Gastrointestinal:  Positive for abdominal pain, diarrhea, nausea and vomiting.  Genitourinary:  Negative for dysuria.  Objective:  BP 90/60   Pulse 87   Temp 98.4 F (36.9 C) (Temporal)   Ht 5' (1.524 m)   Wt 86 lb 4 oz (39.1 kg)   LMP 01/30/2021   SpO2 99%   BMI 16.84 kg/m   Wt Readings from Last 3 Encounters:  02/01/21 86 lb 4 oz (39.1 kg)  12/01/20 90 lb (40.8 kg)  09/14/20 93 lb (42.2 kg)      Physical Exam Constitutional:      General: She is not in acute distress.    Appearance: Normal appearance. She is well-developed. She is not ill-appearing or toxic-appearing.  HENT:     Head: Normocephalic.  Right Ear: Hearing, tympanic membrane, ear canal and external ear normal. Tympanic membrane is not erythematous, retracted or bulging.     Left Ear: Hearing, tympanic membrane, ear canal and external ear normal. Tympanic membrane is not erythematous, retracted or bulging.     Nose: No mucosal edema or rhinorrhea.     Right Sinus: No maxillary sinus tenderness or frontal sinus tenderness.     Left Sinus: No maxillary sinus tenderness or frontal sinus tenderness.     Mouth/Throat:     Pharynx: Uvula midline.  Eyes:     General: Lids are normal. Lids are everted, no foreign bodies appreciated.     Conjunctiva/sclera: Conjunctivae normal.     Pupils: Pupils are equal, round, and reactive to light.  Neck:     Thyroid: No thyroid mass or thyromegaly.     Vascular: No carotid bruit.     Trachea:  Trachea normal.  Cardiovascular:     Rate and Rhythm: Normal rate and regular rhythm.     Pulses: Normal pulses.     Heart sounds: Normal heart sounds, S1 normal and S2 normal. No murmur heard.   No friction rub. No gallop.  Pulmonary:     Effort: Pulmonary effort is normal. No tachypnea or respiratory distress.     Breath sounds: Normal breath sounds. No decreased breath sounds, wheezing, rhonchi or rales.  Abdominal:     General: Bowel sounds are normal.     Palpations: Abdomen is soft.     Tenderness: There is no abdominal tenderness.  Musculoskeletal:     Cervical back: Normal range of motion and neck supple.  Skin:    General: Skin is warm and dry.     Findings: No rash.  Neurological:     Mental Status: She is alert.  Psychiatric:        Mood and Affect: Mood is not anxious or depressed.        Speech: Speech normal.        Behavior: Behavior normal. Behavior is cooperative.        Thought Content: Thought content normal.        Judgment: Judgment normal.      Results for orders placed or performed in visit on 12/01/20  GC/Chlamydia Probe Amp   Specimen: Vaginal; Genital   VA  Result Value Ref Range   Chlamydia trachomatis, NAA Negative Negative   Neisseria Gonorrhoeae by PCR Negative Negative  Cytology - PAP  Result Value Ref Range   Adequacy      Satisfactory for evaluation; transformation zone component PRESENT.   Diagnosis      - Negative for intraepithelial lesion or malignancy (NILM)    This visit occurred during the SARS-CoV-2 public health emergency.  Safety protocols were in place, including screening questions prior to the visit, additional usage of staff PPE, and extensive cleaning of exam room while observing appropriate contact time as indicated for disinfecting solutions.   COVID 19 screen:  No recent travel or known exposure to COVID19 The patient denies respiratory symptoms of COVID 19 at this time. The importance of social distancing was discussed  today.   Assessment and Plan Problem List Items Addressed This Visit     Low weight    Discussed possible causes. She eats very little.  Per pt work up in past, some moodiness/mild dysphoria but denies body dysmorphia feelings.  Could not afford nutritionist. Encouraged her to eat larger meals with p[rotein more frequently, stomach likely shrunken over time.  Recommend follow up with PCP top address further. Consider repeat testing, mood eval and possibly medication like remeron.      Viral gastroenteritis - Primary    Symptomatic care. Advance fluids and diet as tolerated. Prn promethazine .  Pt reassured. Unlikely stomach cancer.          Kerby Nora, MD

## 2021-02-01 NOTE — Patient Instructions (Addendum)
Slowly increase fluids ( gatorade) over the next several hours.  If keeping down liquids.Marland Kitchen try Popsicles, smoothie, chicken soup. Over the day progress to bland solid foods, ie crackers ETc.  Most important is the fluids!  Rest. Can use promethazine if needed.

## 2021-02-22 ENCOUNTER — Ambulatory Visit (INDEPENDENT_AMBULATORY_CARE_PROVIDER_SITE_OTHER): Payer: Managed Care, Other (non HMO) | Admitting: Primary Care

## 2021-02-22 ENCOUNTER — Other Ambulatory Visit: Payer: Self-pay

## 2021-02-22 ENCOUNTER — Encounter: Payer: Self-pay | Admitting: Primary Care

## 2021-02-22 VITALS — BP 110/62 | HR 106 | Temp 98.6°F | Ht 60.0 in | Wt 89.2 lb

## 2021-02-22 DIAGNOSIS — F411 Generalized anxiety disorder: Secondary | ICD-10-CM | POA: Diagnosis not present

## 2021-02-22 DIAGNOSIS — R636 Underweight: Secondary | ICD-10-CM | POA: Diagnosis not present

## 2021-02-22 LAB — COMPREHENSIVE METABOLIC PANEL
ALT: 10 U/L (ref 0–35)
AST: 16 U/L (ref 0–37)
Albumin: 4.2 g/dL (ref 3.5–5.2)
Alkaline Phosphatase: 51 U/L (ref 39–117)
BUN: 6 mg/dL (ref 6–23)
CO2: 28 mEq/L (ref 19–32)
Calcium: 9.5 mg/dL (ref 8.4–10.5)
Chloride: 106 mEq/L (ref 96–112)
Creatinine, Ser: 0.66 mg/dL (ref 0.40–1.20)
GFR: 124.52 mL/min (ref >60.00)
Glucose, Bld: 81 mg/dL (ref 70–99)
Potassium: 4.5 mEq/L (ref 3.5–5.1)
Sodium: 140 mEq/L (ref 135–145)
Total Bilirubin: 0.6 mg/dL (ref 0.2–1.2)
Total Protein: 7.6 g/dL (ref 6.0–8.3)

## 2021-02-22 LAB — PHOSPHORUS: Phosphorus: 2.9 mg/dL (ref 2.3–4.6)

## 2021-02-22 LAB — CBC
HCT: 39.1 % (ref 36.0–46.0)
Hemoglobin: 12.7 g/dL (ref 12.0–15.0)
MCHC: 32.6 g/dL (ref 30.0–36.0)
MCV: 92.9 fl (ref 78.0–100.0)
Platelets: 273 10*3/uL (ref 150.0–400.0)
RBC: 4.21 Mil/uL (ref 3.87–5.11)
RDW: 13.9 % (ref 11.5–15.5)
WBC: 4.1 10*3/uL (ref 4.0–10.5)

## 2021-02-22 LAB — TSH: TSH: 0.41 u[IU]/mL (ref 0.35–5.50)

## 2021-02-22 LAB — VITAMIN D 25 HYDROXY (VIT D DEFICIENCY, FRACTURES): VITD: 29.73 ng/mL — ABNORMAL LOW (ref 30.00–100.00)

## 2021-02-22 LAB — MAGNESIUM: Magnesium: 1.9 mg/dL (ref 1.5–2.5)

## 2021-02-22 NOTE — Progress Notes (Signed)
Subjective:    Patient ID: Krista Flores, female    DOB: [birth year], 20 y.o.   MRN: [id number]  HPI  Krista Flores is a very pleasant 20 y.o. female with a history of marijuana use, GAD, low BMI, viral gastroenteritis who presents today   Evaluated by Dr. Ermalene Searing on 02/01/21 for nasal congestion with 8 episodes of vomiting, abdominal pain, decreased appetite. During this visit the patient mentioned that "my mom thinks I have stomach cancer". During this visit it was recommended to increase meals with protein and to follow up with PCP.  Today she endorses chronic early satiety with any meal, will eat a few bites and then become full.  She continues smoke marijuana several times daily despite recommendations previously for discontinuation.  She denies feeling overly anxious/nervous.    She would like to gain weight with a goal weight of 110 pounds. She is eating an entire meal once daily, mostly fast food or take out food.  The remainder of the day she will snack, mostly a few bites, packed and processed snacks mostly. No vegetables or fruit.   Unfortunately she cannot see the nutritionist last year due to cost.  She is interested in seeing a nutritionist now.  She denies binge eating, purging,food aversion. She endorses that she enjoys eating, has a good appetite, but will experience fullness immediately.  Her viral symptoms from 2 weeks ago have resolved.  No nausea or vomiting.  Wt Readings from Last 3 Encounters:  02/22/21 89 lb 3.2 oz (40.5 kg)  02/01/21 86 lb 4 oz (39.1 kg)  12/01/20 90 lb (40.8 kg)     Review of Systems  Constitutional:  Negative for fatigue.  Gastrointestinal:  Negative for abdominal pain, nausea and vomiting.  Psychiatric/Behavioral:  The patient is not nervous/anxious.         Past Medical History:  Diagnosis Date   Anxiety    BV (bacterial vaginosis)    Chlamydia 2017-2020   x4   Chlamydia infection 08/15/2016   Positive Chlamydia on 08/08/2016 and  10/19/2016   Frequent headaches    Genital herpes 02/2020   type 2 on culture   UTI (lower urinary tract infection)     Social History   Socioeconomic History   Marital status: Single    Spouse name: Not on file   Number of children: 0   Years of education: 12   Highest education level: Not on file  Occupational History   Occupation: Conservation officer, nature    Comment: Graduated from [employer] 2018   Occupation: student    Comment: [employer]  Tobacco Use   Smoking status: Never   Smokeless tobacco: Never  Vaping Use   Vaping Use: Never used  Substance and Sexual Activity   Alcohol use: No    Alcohol/week: 0.0 standard drinks   Drug use: Yes    Types: Marijuana    Comment: heavy use   Sexual activity: Yes    Partners: Male    Birth control/protection: Patch  Other Topics Concern   Not on file  Social History Narrative   Student at [employer]   She would like to a Engineer, mining.    Enjoys spending time with friends.   Will be starting a job with [employer] in August 2019   Social Determinants of Health   Financial Resource Strain: Not on file  Food Insecurity: Not on file  Transportation Needs: Not on file  Physical Activity: Not on file  Stress: Not on file  Social Connections: Not on file  Intimate Partner Violence: Not on file    Past Surgical History:  Procedure Laterality Date   NO PAST SURGERIES      Family History  Problem Relation Age of Onset   Arthritis Maternal Grandmother    Stroke Maternal Grandmother    Hypertension Maternal Grandmother    Diabetes Maternal Grandmother    Hypercholesterolemia Maternal Grandmother    Hypertension Maternal Grandfather    Hypercholesterolemia Maternal Grandfather    Other Mother        acid reflux    Allergies  Allergen Reactions   Penicillins Hives    Current Outpatient Medications on File Prior to Visit  Medication Sig Dispense Refill   Ascorbic Acid (VITAMIN C) 500 MG CHEW Chew 500 mg by mouth daily.     Multiple Vitamin  (MULTIVITAMIN WITH MINERALS) TABS tablet Take 1 tablet by mouth daily.     valACYclovir (VALTREX) 500 MG tablet Take 1 tablet (500 mg total) by mouth daily. 90 tablet 3   XULANE 150-35 MCG/24HR transdermal patch Place 1 patch onto the skin once a week. 3 patch 4   No current facility-administered medications on file prior to visit.    BP 110/62   Pulse (!) 106   Temp 98.6 F (37 C) (Temporal)   Ht 5' (1.524 m)   Wt 89 lb 3.2 oz (40.5 kg)   LMP 01/30/2021   SpO2 100%   BMI 17.42 kg/m  Objective:   Physical Exam Constitutional:      Appearance: She is underweight. She is not ill-appearing.     Comments: She does not appear cachectic or physically malnourished.  Eyes:     Conjunctiva/sclera: Conjunctivae normal.  Cardiovascular:     Rate and Rhythm: Normal rate and regular rhythm.  Pulmonary:     Effort: Pulmonary effort is normal.     Breath sounds: Normal breath sounds.  Musculoskeletal:     Cervical back: Neck supple.     Comments: No bony prominences noted to thoracic spine.    Skin:    General: Skin is warm and dry.     Coloration: Skin is not pale.     Comments: No evidence of coarse hair growth on extremities or trunk.   Psychiatric:        Mood and Affect: Mood normal.          Assessment & Plan:      This visit occurred during the SARS-CoV-2 public health emergency.  Safety protocols were in place, including screening questions prior to the visit, additional usage of staff PPE, and extensive cleaning of exam room while observing appropriate contact time as indicated for disinfecting solutions.

## 2021-02-22 NOTE — Patient Instructions (Addendum)
It was great to see you.  Stop by the lab prior to leaving today. I will notify you of your results once received.  You will be contacted regarding your referral to your nutritionist .  Please let us know if you have not been contacted within two weeks.  Try to cut back on smoking marijuana, as this could be contributing to your poor appetite.

## 2021-02-22 NOTE — Assessment & Plan Note (Signed)
Patient denies today. She doesn't appear overly anxious.   Continue to monitor.

## 2021-02-22 NOTE — Assessment & Plan Note (Addendum)
Chronic inadequate intake due to poor appetite, possibly due to daily marijuana use.  Unclear if this is an eating disorder, HPI today not as suggestive. No physical signs noted.  She is motivated to gain weight, referral to nutritionist placed today.  Strongly encouraged her to stop smoking marijuana.   Labs pending.   I evaluated patient, was consulted regarding treatment, and agree with assessment and plan per Kathaleen Maser, RN, DNP student.   Krista Reel, NP-C

## 2021-02-22 NOTE — Progress Notes (Signed)
Established Patient Office Visit  Subjective:  Patient ID: Krista Flores, female    DOB: [birth year]  Age: 20 y.o. MRN: [id number]  CC: No chief complaint on file.   HPI [name] is a 20 year old female with a past medical history of poor appetite and low weight, GAD, and exertional chest pain presents for poor appetite and low BMI. She was last seen for this  A referral for a nutritionist was placed 5/21, but she did not go due to cost. At that time, she was using marijuana heavily and was advised to stop, labs were done which were unremarkable.   She reports poor appetite. She feels hungry and then subsequently feels full after a very small amount of food. She then stops eating so she doesn't feel uncomfortable. She denies nausea, vomiting, diarrhea, symptoms of GERD.  A typical diet for her includes a variety of foods, mainly take-out and fast food. She eats one meal per day and snacks the rest of day.  She has tried a variety of protein, dietary supplements, and Appetamine to increase her body weight.  She currently smokes marijuana, multiple times per day and has for several years. She enjoys the way she makes you feel, and not using for stress relief or to increase appetite.   She is not happy with her body at this time, she desires to put on more weight. She denies that she has an eating disorder. She denies binge eating, eating and purging, and anorexia.   She desires to pursue a nutritionist at this time.   She is having regular periods since starting Xulane. Her LMP is today.    Wt Readings from Last 3 Encounters:  02/22/21 89 lb 3.2 oz (40.5 kg)  02/01/21 86 lb 4 oz (39.1 kg)  12/01/20 90 lb (40.8 kg)       (Weight and BP) Past Medical History:  Diagnosis Date   Anxiety    BV (bacterial vaginosis)    Chlamydia 2017-2020   x4   Chlamydia infection 08/15/2016   Positive Chlamydia on 08/08/2016 and 10/19/2016   Frequent headaches    Genital herpes 02/2020    type 2 on culture   UTI (lower urinary tract infection)     Past Surgical History:  Procedure Laterality Date   NO PAST SURGERIES      Family History  Problem Relation Age of Onset   Arthritis Maternal Grandmother    Stroke Maternal Grandmother    Hypertension Maternal Grandmother    Diabetes Maternal Grandmother    Hypercholesterolemia Maternal Grandmother    Hypertension Maternal Grandfather    Hypercholesterolemia Maternal Grandfather    Other Mother        acid reflux    Social History   Socioeconomic History   Marital status: Single    Spouse name: Not on file   Number of children: 0   Years of education: 12   Highest education level: Not on file  Occupational History   Occupation: Conservation officer, 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    Enjoys spending time with friends.   Will be starting a job with [employer] in August 2019   Social Determinants of Health   Financial Resource Strain: Not on file  Food Insecurity: Not on file  Transportation Needs: Not on file  Physical Activity: Not on file  Stress: Not on file  Social Connections: Not on file  Intimate Partner Violence: Not on file    Outpatient Medications Prior to Visit  Medication Sig Dispense Refill   Ascorbic Acid (VITAMIN C) 500 MG CHEW Chew 500 mg by mouth daily.     Multiple Vitamin (MULTIVITAMIN WITH MINERALS) TABS tablet Take 1 tablet by mouth daily.     valACYclovir (VALTREX) 500 MG tablet Take 1 tablet (500 mg  total) by mouth daily. 90 tablet 3   XULANE 150-35 MCG/24HR transdermal patch Place 1 patch onto the skin once a week. 3 patch 4   No facility-administered medications prior to visit.    Allergies  Allergen Reactions   Penicillins Hives    ROS Review of Systems  Constitutional:  Positive for appetite change. Negative for unexpected weight change.  Respiratory:  Negative for cough, chest tightness and shortness of breath.   Cardiovascular:  Negative for chest pain and palpitations.  Gastrointestinal:  Negative for abdominal distention, abdominal pain, blood in stool, constipation, diarrhea, nausea and vomiting.  Genitourinary: Negative.   Skin: Negative.   Neurological:  Negative for dizziness, tremors, light-headedness and headaches.  Psychiatric/Behavioral: Negative.  Negative for dysphoric mood. The patient is not nervous/anxious.      Objective:    Physical Exam Constitutional:      Appearance: Normal appearance.  HENT:     Head: Normocephalic.  Cardiovascular:     Pulses: Normal pulses.  Pulmonary:     Effort: Pulmonary effort is normal.     Breath sounds: Normal breath sounds.  Abdominal:     General: Abdomen is flat. Bowel sounds are normal. There is no distension.     Palpations: There is no mass.     Tenderness: There is no abdominal tenderness. There is no guarding.  Skin:    General: Skin is warm and dry.  Neurological:     General: No focal deficit present.     Mental Status: She is alert and oriented to person, place, and time. Mental status is at baseline.  Psychiatric:        Mood and Affect: Mood normal.        Behavior: Behavior normal.    LMP 01/30/2021  Wt Readings from Last 3 Encounters:  02/01/21 86 lb 4 oz (39.1 kg)  12/01/20 90 lb (40.8 kg)  09/14/20 93 lb (42.2 kg)     Health Maintenance Due  Topic Date Due   COVID-19 Vaccine (2 - Pfizer series) 11/19/2019   INFLUENZA VACCINE  Never done    There are no preventive care reminders to  display for this patient.  Lab Results  Component Value Date   TSH 0.87 09/05/2019   Lab Results  Component Value Date   WBC 4.7 09/05/2019   HGB 12.5 09/05/2019   HCT 37.8 09/05/2019   MCV 90.2 09/05/2019   PLT 250.0 09/05/2019   Lab Results  Component Value Date   NA 135 09/05/2019   K 4.1 09/05/2019  CO2 26 09/05/2019   GLUCOSE 85 09/05/2019   BUN 14 09/05/2019   CREATININE 0.70 09/05/2019   BILITOT 0.2 06/25/2019   ALKPHOS 57 06/25/2019   AST 22 06/25/2019   ALT 11 06/25/2019   PROT 7.4 06/25/2019   ALBUMIN 4.1 06/25/2019   CALCIUM 9.5 09/05/2019   ANIONGAP 5 (L) 02/28/2014   GFR 127.87 09/05/2019   No results found for: CHOL No results found for: HDL No results found for: LDLCALC No results found for: TRIG No results found for: CHOLHDL Lab Results  Component Value Date   HGBA1C 5.1 06/25/2019      Assessment & Plan:   Problem List Items Addressed This Visit   None   No orders of the defined types were placed in this encounter.   Follow-up: No follow-ups on file.    Devoria Glassing, RN

## 2021-02-24 ENCOUNTER — Other Ambulatory Visit: Payer: Self-pay | Admitting: Primary Care

## 2021-02-24 DIAGNOSIS — R636 Underweight: Secondary | ICD-10-CM

## 2021-02-24 DIAGNOSIS — R630 Anorexia: Secondary | ICD-10-CM

## 2021-02-24 DIAGNOSIS — R7989 Other specified abnormal findings of blood chemistry: Secondary | ICD-10-CM

## 2021-03-08 ENCOUNTER — Other Ambulatory Visit: Payer: Self-pay | Admitting: Primary Care

## 2021-03-10 ENCOUNTER — Other Ambulatory Visit (INDEPENDENT_AMBULATORY_CARE_PROVIDER_SITE_OTHER): Payer: Managed Care, Other (non HMO)

## 2021-03-10 ENCOUNTER — Other Ambulatory Visit: Payer: Self-pay

## 2021-03-10 DIAGNOSIS — R7989 Other specified abnormal findings of blood chemistry: Secondary | ICD-10-CM | POA: Diagnosis not present

## 2021-03-10 DIAGNOSIS — R636 Underweight: Secondary | ICD-10-CM | POA: Diagnosis not present

## 2021-03-10 DIAGNOSIS — R630 Anorexia: Secondary | ICD-10-CM | POA: Diagnosis not present

## 2021-03-10 LAB — T3, FREE: T3, Free: 3.8 pg/mL (ref 2.3–4.2)

## 2021-03-10 LAB — TSH: TSH: 0.37 u[IU]/mL (ref 0.35–5.50)

## 2021-03-10 LAB — T4, FREE: Free T4: 0.76 ng/dL (ref 0.60–1.60)

## 2021-04-21 ENCOUNTER — Ambulatory Visit (INDEPENDENT_AMBULATORY_CARE_PROVIDER_SITE_OTHER): Payer: Managed Care, Other (non HMO) | Admitting: Primary Care

## 2021-04-21 ENCOUNTER — Encounter: Payer: Self-pay | Admitting: Primary Care

## 2021-04-21 ENCOUNTER — Other Ambulatory Visit: Payer: Self-pay

## 2021-04-21 DIAGNOSIS — R519 Headache, unspecified: Secondary | ICD-10-CM

## 2021-04-21 DIAGNOSIS — G43909 Migraine, unspecified, not intractable, without status migrainosus: Secondary | ICD-10-CM

## 2021-04-21 MED ORDER — KETOROLAC TROMETHAMINE 60 MG/2ML IM SOLN
60.0000 mg | Freq: Once | INTRAMUSCULAR | Status: AC
  Administered 2021-04-21: 60 mg via INTRAMUSCULAR

## 2021-04-21 NOTE — Addendum Note (Signed)
Addended by: Donnamarie Poag on: 04/21/2021 03:59 PM   Modules accepted: Orders

## 2021-04-21 NOTE — Progress Notes (Signed)
Subjective:    Patient ID: Krista Flores, female    DOB: 2001/02/12, 21 y.o.   MRN: [id number]  HPI  Krista Flores is a very pleasant 21 y.o. female with a history of GAD, underweight due to inadequate calorie intake who presents today to discuss headaches.  No prior history of migraines. About 6 evenings ago she was smoking marijuana that was different from her typical batch, shortly after developed a sharp and throbbing headache to the left occipital lobe. During the night she woke from sleep from her left sided occipital headache which was throbbing. She noticed nausea, neck stiffness, and had to turn the TV off as the lights were too bright.  Today she endorses residual upper neck pain with radiation of pain to her bilateral scapular region. Her headache began prior to the neck pain. She denies decrease in ROM but notices a pulling sensation with movement.   Since last week she continues to notice a left occipital lobe headache that has improved overall. She's also noticed a mild, right sided occipital headache. She's been taking aspirin or Goody Powder without improvement. She's not had nausea or photophobia since the initial episode last week.   History of frequent headaches for years which are located to the bilateral temporal and frontal lobes, occurring 4-5 times monthly, lasting for one day. Normally takes aspirin or Goody Powder with resolve. Overall headaches are manageable and don't disrupt her. She does have phonophobia at times.  She has a family history of headaches in her mother.   Review of Systems  Eyes:  Positive for photophobia.  Musculoskeletal:  Positive for myalgias.  Neurological:  Positive for headaches.        Past Medical History:  Diagnosis Date   Anxiety    BV (bacterial vaginosis)    Chlamydia 2017-2020   x4   Chlamydia infection 08/15/2016   Positive Chlamydia on 08/08/2016 and 10/19/2016   Frequent headaches    Genital herpes 02/2020   type 2 on  culture   UTI (lower urinary tract infection)    Viral gastroenteritis 02/01/2021    Social History   Socioeconomic History   Marital status: Single    Spouse name: Not on file   Number of children: 0   Years of education: 12   Highest education level: Not on file  Occupational History   Occupation: Conservation officer, nature    Comment: Graduated from [employer] 2018   Occupation: student    Comment: [employer]  Tobacco Use   Smoking status: Never   Smokeless tobacco: Never  Vaping Use   Vaping Use: Never used  Substance and Sexual Activity   Alcohol use: No    Alcohol/week: 0.0 standard drinks   Drug use: Yes    Types: Marijuana    Comment: heavy use   Sexual activity: Yes    Partners: Male    Birth control/protection: Patch  Other Topics Concern   Not on file  Social History Narrative   Student at [employer]   She would like to a Engineer, mining.    Enjoys spending time with friends.   Will be starting a job with [employer] in August 2019   Social Determinants of Health   Financial Resource Strain: Not on file  Food Insecurity: Not on file  Transportation Needs: Not on file  Physical Activity: Not on file  Stress: Not on file  Social Connections: Not on file  Intimate Partner Violence: Not on file    Past Surgical History:  Procedure Laterality Date   NO PAST SURGERIES      Family History  Problem Relation Age of Onset   Arthritis Maternal Grandmother    Stroke Maternal Grandmother    Hypertension Maternal Grandmother    Diabetes Maternal Grandmother    Hypercholesterolemia Maternal Grandmother    Hypertension Maternal Grandfather    Hypercholesterolemia Maternal Grandfather    Other Mother        acid reflux    Allergies  Allergen Reactions   Penicillins Hives    Current Outpatient Medications on File Prior to Visit  Medication Sig Dispense Refill   Ascorbic Acid (VITAMIN C) 500 MG CHEW Chew 500 mg by mouth daily.     Multiple Vitamin (MULTIVITAMIN WITH MINERALS) TABS  tablet Take 1 tablet by mouth daily.     valACYclovir (VALTREX) 500 MG tablet Take 1 tablet (500 mg total) by mouth daily. 90 tablet 3   XULANE 150-35 MCG/24HR transdermal patch Place 1 patch onto the skin once a week. 3 patch 4   No current facility-administered medications on file prior to visit.    There were no vitals taken for this visit. Objective:   Physical Exam Eyes:     Extraocular Movements: Extraocular movements intact.  Neck:      Comments: Mild pulling sensation to neck with right lateral bending. Cardiovascular:     Rate and Rhythm: Normal rate and regular rhythm.  Pulmonary:     Effort: Pulmonary effort is normal.     Breath sounds: Normal breath sounds.  Musculoskeletal:     Cervical back: Normal range of motion and neck supple. Pain with movement present. No spinous process tenderness or muscular tenderness.  Skin:    General: Skin is warm and dry.  Neurological:     Mental Status: She is oriented to person, place, and time.     Cranial Nerves: No cranial nerve deficit.          Assessment & Plan:      This visit occurred during the SARS-CoV-2 public health emergency.  Safety protocols were in place, including screening questions prior to the visit, additional usage of staff PPE, and extensive cleaning of exam room while observing appropriate contact time as indicated for disinfecting solutions.

## 2021-04-21 NOTE — Assessment & Plan Note (Signed)
First occurrence.   Treat today with Toradol IM 60 mg. Fortunately she's improved already.  She will notify if migraine does not abate and/or returns.  Neuro exam today negative. FH of headaches and migraines in mom. Discouraged use of mariajuana.

## 2021-04-21 NOTE — Patient Instructions (Signed)
Marijuana can cause headaches.  Refrain from using [employer] frequently. This can cause stomach ulcers and bleeding.  Update me if no improvement!  It was a pleasure to see you today!

## 2021-04-21 NOTE — Assessment & Plan Note (Signed)
Chronic for years.  Discussed the option for a daily preventative headache treatment such as Propranolol. She kindly declined as her headaches are overall not bothersome.  It does seem like she had a migraine last week that has lingered. For this we will provide her with Toradol 60 mg IM today.   Discussed to refrain from [facility]. She will update if migraine type headache does not abate and/or if her frequent headaches become bothersome.

## 2021-09-13 ENCOUNTER — Other Ambulatory Visit: Payer: Self-pay | Admitting: Obstetrics

## 2021-09-13 DIAGNOSIS — Z3040 Encounter for surveillance of contraceptives, unspecified: Secondary | ICD-10-CM

## 2021-10-18 ENCOUNTER — Ambulatory Visit: Payer: Self-pay | Admitting: Primary Care

## 2021-10-21 ENCOUNTER — Ambulatory Visit (INDEPENDENT_AMBULATORY_CARE_PROVIDER_SITE_OTHER): Payer: BC Managed Care – PPO | Admitting: Primary Care

## 2021-10-21 ENCOUNTER — Encounter: Payer: Self-pay | Admitting: Primary Care

## 2021-10-21 VITALS — BP 110/62 | HR 67 | Temp 98.6°F | Ht 60.0 in | Wt 86.0 lb

## 2021-10-21 DIAGNOSIS — L74510 Primary focal hyperhidrosis, axilla: Secondary | ICD-10-CM

## 2021-10-21 DIAGNOSIS — Z0000 Encounter for general adult medical examination without abnormal findings: Secondary | ICD-10-CM | POA: Diagnosis not present

## 2021-10-21 DIAGNOSIS — Z3045 Encounter for surveillance of transdermal patch hormonal contraceptive device: Secondary | ICD-10-CM

## 2021-10-21 DIAGNOSIS — F411 Generalized anxiety disorder: Secondary | ICD-10-CM

## 2021-10-21 DIAGNOSIS — A6004 Herpesviral vulvovaginitis: Secondary | ICD-10-CM

## 2021-10-21 DIAGNOSIS — R3915 Urgency of urination: Secondary | ICD-10-CM | POA: Diagnosis not present

## 2021-10-21 DIAGNOSIS — R519 Headache, unspecified: Secondary | ICD-10-CM

## 2021-10-21 DIAGNOSIS — R636 Underweight: Secondary | ICD-10-CM

## 2021-10-21 LAB — POC URINALSYSI DIPSTICK (AUTOMATED)
Bilirubin, UA: NEGATIVE
Blood, UA: NEGATIVE
Glucose, UA: NEGATIVE
Ketones, UA: NEGATIVE
Leukocytes, UA: NEGATIVE
Nitrite, UA: NEGATIVE
Protein, UA: POSITIVE — AB
Spec Grav, UA: 1.01 (ref 1.010–1.025)
Urobilinogen, UA: 0.2 E.U./dL
pH, UA: 8.5 — AB (ref 5.0–8.0)

## 2021-10-21 LAB — CBC
HCT: 36.1 % (ref 35.0–45.0)
Hemoglobin: 11.9 g/dL (ref 11.7–15.5)
MCH: 30.9 pg (ref 27.0–33.0)
MCHC: 33 g/dL (ref 32.0–36.0)
MCV: 93.8 fL (ref 80.0–100.0)
MPV: 11.2 fL (ref 7.5–12.5)
Platelets: 270 10*3/uL (ref 140–400)
RBC: 3.85 10*6/uL (ref 3.80–5.10)
RDW: 12.2 % (ref 11.0–15.0)
WBC: 5.1 10*3/uL (ref 3.8–10.8)

## 2021-10-21 MED ORDER — GLYCOPYRROLATE 1 MG PO TABS: 1.0000 mg | ORAL_TABLET | Freq: Every day | ORAL | 0 refills | Status: DC

## 2021-10-21 NOTE — Addendum Note (Signed)
Addended by: Alvina Chou on: 10/21/2021 02:47 PM   Modules accepted: Orders

## 2021-10-21 NOTE — Assessment & Plan Note (Signed)
Controlled.   Continue Valtrex 500 mg daily. Follows with GYN

## 2021-10-21 NOTE — Assessment & Plan Note (Signed)
Immunizations UTD. Pap smear UTD  Discussed the importance of a healthy diet and regular exercise in order for weight loss, and to reduce the risk of further co-morbidity.  Exam stable. Labs pending.  Follow up in 1 year for repeat physical.  

## 2021-10-21 NOTE — Progress Notes (Signed)
Subjective:    Patient ID: Krista Flores, female    DOB: [birth year], 21 y.o.   MRN: [id number]  HPI  Krista Flores is a very pleasant 21 y.o. female who presents today for complete physical and follow up of chronic conditions.  She would like refills of her hyperhidrosis treatment. Previously managed on glycopyrrolate 1 mg for which she took daily. She did well on this regimen. She mostly experiences symptoms to her hands and feet, sometimes axilla.   She would also like to mention urinary urgency that began 10 days ago. She's also noticed little output when she does urinate. She increased water intake recently. She denies dysuria, hematuria, abdominal pain, flank pain.    Immunizations: -Tetanus: 2021 -Influenza: Did not complete last season -Covid-19: Completed 2 vaccine  -HPV: Completed series  Diet: Poor diet, eating fast food. Eats small portion sizes.  Exercise: No regular exercise.  Eye exam: Completed years ago Dental exam: Completes semi-annually   Pap Smear: Completed in August 2022  BP Readings from Last 3 Encounters:  10/21/21 110/62  04/21/21 92/78  02/22/21 110/62   Wt Readings from Last 3 Encounters:  10/21/21 86 lb (39 kg)  04/21/21 88 lb (39.9 kg)  02/22/21 89 lb 3.2 oz (40.5 kg)      Review of Systems  Constitutional:  Negative for unexpected weight change.  HENT:  Negative for rhinorrhea.   Respiratory:  Negative for cough and shortness of breath.   Cardiovascular:  Negative for chest pain.  Gastrointestinal:  Negative for constipation and diarrhea.  Genitourinary:  Positive for urgency. Negative for difficulty urinating, dysuria, frequency, hematuria and vaginal discharge.  Musculoskeletal:  Negative for arthralgias and myalgias.  Skin:  Negative for rash.       Excessive sweating  Allergic/Immunologic: Negative for environmental allergies.  Neurological:  Negative for dizziness and headaches.         Past Medical History:   Diagnosis Date   Anxiety    BV (bacterial vaginosis)    Chest pain, exertional 05/11/2015   Chlamydia 2017-2020   x4   Chlamydia infection 08/15/2016   Positive Chlamydia on 08/08/2016 and 10/19/2016   Chronic cough 06/23/2020   Frequent headaches    Genital herpes 02/2020   type 2 on culture   UTI (lower urinary tract infection)    Viral gastroenteritis 02/01/2021    Social History   Socioeconomic History   Marital status: Single    Spouse name: Not on file   Number of children: 0   Years of education: 12   Highest education level: Not on file  Occupational History   Occupation: Conservation officer, 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    Enjoys spending time with friends.   Will be starting a job with [employer] in August 2019   Social Determinants of Health   Financial  Resource Strain: Not on file  Food Insecurity: Not on file  Transportation Needs: Not on file  Physical Activity: Not on file  Stress: Not on file  Social Connections: Not on file  Intimate Partner Violence: Not on file    Past Surgical History:  Procedure Laterality Date   NO PAST SURGERIES      Family History  Problem Relation Age of Onset   Arthritis Maternal Grandmother    Stroke Maternal Grandmother    Hypertension Maternal Grandmother    Diabetes Maternal Grandmother    Hypercholesterolemia Maternal Grandmother    Hypertension Maternal Grandfather    Hypercholesterolemia Maternal Grandfather    Other  Mother        acid reflux    Allergies  Allergen Reactions   Penicillins Hives    Current Outpatient Medications on File Prior to Visit  Medication Sig Dispense Refill   Ascorbic Acid (VITAMIN C) 500 MG CHEW Chew 500 mg by mouth daily.     Multiple Vitamin (MULTIVITAMIN WITH MINERALS) TABS tablet Take 1 tablet by mouth daily.     valACYclovir (VALTREX) 500 MG tablet Take 1 tablet (500 mg total) by mouth daily. 90 tablet 3   XULANE 150-35 MCG/24HR transdermal patch APPLY 1 PATCH ONCE A WEEK 9 patch 1   No current facility-administered medications on file prior to visit.    BP 110/62   Pulse 67   Temp 98.6 F (37 C) (Oral)   Ht 5' (1.524 m)   Wt 86 lb (39 kg)   SpO2 100%   BMI 16.80 kg/m  Objective:   Physical Exam HENT:     Right Ear: Tympanic membrane and ear canal normal.     Left Ear: Tympanic membrane and ear canal normal.     Nose: Nose normal.  Eyes:     Conjunctiva/sclera: Conjunctivae normal.     Pupils: Pupils are equal, round, and reactive to light.  Neck:     Thyroid: No thyromegaly.  Cardiovascular:     Rate and Rhythm: Normal rate and regular rhythm.     Heart sounds: No murmur heard. Pulmonary:     Effort: Pulmonary effort is normal.     Breath sounds: Normal breath sounds. No rales.  Abdominal:     General: Bowel sounds are normal.     Palpations: Abdomen is soft.     Tenderness: There is no abdominal tenderness.  Musculoskeletal:        General: Normal range of motion.     Cervical back: Neck supple.  Lymphadenopathy:     Cervical: No cervical adenopathy.  Skin:    General: Skin is warm and dry.     Findings: No rash.  Neurological:     Mental Status: She is alert and oriented to person, place, and time.     Cranial Nerves: No cranial nerve deficit.     Deep Tendon Reflexes: Reflexes are normal and symmetric.  Psychiatric:        Mood and Affect: Mood normal.           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   Resume glycopyrrolate 1 mg daily.  Refill sent to pharmacy.      Relevant Medications   glycopyrrolate (ROBINUL) 1 MG tablet     Genitourinary   Herpes simplex vulvovaginitis    Controlled.  Continue Valtrex 500 mg daily. Follows with GYN        Other   Contraception management    Following with GYN. Continue Xulane patches weekly.      GAD (generalized anxiety disorder)    Manageable per patient.  No concerns today.  Continue to monitor.       Underweight due to inadequate caloric intake    Patient endorses increased caloric intake, however, weight is about the same.  We did discuss symptoms of anorexia and other eating disorders, she denies all symptoms. She never connected with a nutritionist in the past, declines now.  Strongly encouraged a healthy and nutritious diet.        Relevant Orders   TSH   T4, free   CBC   Comprehensive metabolic panel   Frequent headaches    Improved!  Continue to monitor.       Urinary urgency    UA today negative.   After further discussion she endorses very little water intake. Discussed to increase water.  She denies vaginal symptoms, declines STD testing at this time. She will return if symptoms persist.      Relevant Orders   POCT Urinalysis Dipstick (Automated) (Completed)   Preventative health care - Primary    Immunizations UTD. Pap smear UTD  Discussed the importance of a healthy diet and regular exercise in order for weight loss, and to reduce the risk of further co-morbidity.  Exam stable. Labs pending.  Follow up in 1 year for repeat physical.           [name], NP

## 2021-10-21 NOTE — Assessment & Plan Note (Signed)
Patient endorses increased caloric intake, however, weight is about the same.  We did discuss symptoms of anorexia and other eating disorders, she denies all symptoms. She never connected with a nutritionist in the past, declines now.  Strongly encouraged a healthy and nutritious diet.

## 2021-10-21 NOTE — Addendum Note (Signed)
Addended by: Alvina Chou on: 10/21/2021 02:59 PM   Modules accepted: Orders

## 2021-10-21 NOTE — Assessment & Plan Note (Signed)
Improved. Continue to monitor. 

## 2021-10-21 NOTE — Patient Instructions (Addendum)
Stop by the lab prior to leaving today. I will notify you of your results once received.   Increase your water intake to at least 4 bottles daily.  Work on [employer].   It was a pleasure to see you today!  Preventive Care 58-23 Years Old, Female Preventive care refers to lifestyle choices and visits with your health care provider that can promote health and wellness. Preventive care visits are also called wellness exams. What can I expect for my preventive care visit? Counseling During your preventive care visit, your health care provider may ask about your: Medical history, including: Past medical problems. Family medical history. Pregnancy history. Current health, including: Menstrual cycle. Method of birth control. Emotional well-being. Home life and relationship well-being. Sexual activity and sexual health. Lifestyle, including: Alcohol, nicotine or tobacco, and drug use. Access to firearms. Diet, exercise, and sleep habits. Work and work Statistician. Sunscreen use. Safety issues such as seatbelt and bike helmet use. Physical exam Your health care provider may check your: Height and weight. These may be used to calculate your BMI (body mass index). BMI is a measurement that tells if you are at a healthy weight. Waist circumference. This measures the distance around your waistline. This measurement also tells if you are at a healthy weight and may help predict your risk of certain diseases, such as type 2 diabetes and high blood pressure. Heart rate and blood pressure. Body temperature. Skin for abnormal spots. What immunizations do I need?  Vaccines are usually given at various ages, according to a schedule. Your health care provider will recommend vaccines for you based on your age, medical history, and lifestyle or other factors, such as travel or where you work. What tests do I need? Screening Your health care provider may recommend screening tests for  certain conditions. This may include: Pelvic exam and Pap test. Lipid and cholesterol levels. Diabetes screening. This is done by checking your blood sugar (glucose) after you have not eaten for a while (fasting). Hepatitis B test. Hepatitis C test. HIV (human immunodeficiency virus) test. STI (sexually transmitted infection) testing, if you are at risk. BRCA-related cancer screening. This may be done if you have a family history of breast, ovarian, tubal, or peritoneal cancers. Talk with your health care provider about your test results, treatment options, and if necessary, the need for more tests. Follow these instructions at home: Eating and drinking  Eat a healthy diet that includes fresh fruits and vegetables, whole grains, lean protein, and low-fat dairy products. Take vitamin and mineral supplements as recommended by your health care provider. Do not drink alcohol if: Your health care provider tells you not to drink. You are pregnant, may be pregnant, or are planning to become pregnant. If you drink alcohol: Limit how much you have to 0-1 drink a day. Know how much alcohol is in your drink. In the [place] one drink equals one 12 oz bottle of beer (355 mL), one 5 oz glass of wine (148 mL), or one 1 oz glass of hard liquor (44 mL). Lifestyle Brush your teeth every morning and night with fluoride toothpaste. Floss one time each day. Exercise for at least 30 minutes 5 or more days each week. Do not use any products that contain nicotine or tobacco. These products include cigarettes, chewing tobacco, and vaping devices, such as e-cigarettes. If you need help quitting, ask your health care provider. Do not use drugs. If you are sexually active, practice safe sex. Use a condom  or other form of protection to prevent STIs. If you do not wish to become pregnant, use a form of birth control. If you plan to become pregnant, see your health care provider for a prepregnancy visit. Find healthy  ways to manage stress, such as: Meditation, yoga, or listening to music. Journaling. Talking to a trusted person. Spending time with friends and family. Minimize exposure to UV radiation to reduce your risk of skin cancer. Safety Always wear your seat belt while driving or riding in a vehicle. Do not drive: If you have been drinking alcohol. Do not ride with someone who has been drinking. If you have been using any mind-altering substances or drugs. While texting. When you are tired or distracted. Wear a helmet and other protective equipment during sports activities. If you have firearms in your house, make sure you follow all gun safety procedures. Seek help if you have been physically or sexually abused. What's next? Go to your health care provider once a year for an annual wellness visit. Ask your health care provider how often you should have your eyes and teeth checked. Stay up to date on all vaccines. This information is not intended to replace advice given to you by your health care provider. Make sure you discuss any questions you have with your health care provider. Document Revised: 09/22/2020 Document Reviewed: 09/22/2020 Elsevier Patient Education  [place].

## 2021-10-21 NOTE — Assessment & Plan Note (Signed)
Following with GYN. Continue Xulane patches weekly.

## 2021-10-21 NOTE — Assessment & Plan Note (Signed)
Manageable per patient.  No concerns today.  Continue to monitor.

## 2021-10-21 NOTE — Assessment & Plan Note (Addendum)
Chronic and ongoing symptoms.   Resume glycopyrrolate 1 mg daily.  Refill sent to pharmacy.

## 2021-10-21 NOTE — Assessment & Plan Note (Addendum)
UA today negative.   After further discussion she endorses very little water intake. Discussed to increase water.  She denies vaginal symptoms, declines STD testing at this time. She will return if symptoms persist.

## 2021-10-22 LAB — COMPREHENSIVE METABOLIC PANEL
AG Ratio: 1.3 (calc) (ref 1.0–2.5)
ALT: 8 U/L (ref 6–29)
AST: 15 U/L (ref 10–30)
Albumin: 4 g/dL (ref 3.6–5.1)
Alkaline phosphatase (APISO): 47 U/L (ref 31–125)
BUN: 8 mg/dL (ref 7–25)
CO2: 24 mmol/L (ref 20–32)
Calcium: 9.2 mg/dL (ref 8.6–10.2)
Chloride: 105 mmol/L (ref 98–110)
Creat: 0.72 mg/dL (ref 0.50–0.96)
Globulin: 3.1 g/dL (calc) (ref 1.9–3.7)
Glucose, Bld: 82 mg/dL (ref 65–99)
Potassium: 3.9 mmol/L (ref 3.5–5.3)
Sodium: 139 mmol/L (ref 135–146)
Total Bilirubin: 0.3 mg/dL (ref 0.2–1.2)
Total Protein: 7.1 g/dL (ref 6.1–8.1)

## 2021-10-22 LAB — T4, FREE: Free T4: 0.9 ng/dL (ref 0.8–1.8)

## 2021-10-22 LAB — TSH: TSH: 1.28 mIU/L

## 2021-12-07 ENCOUNTER — Ambulatory Visit: Payer: Managed Care, Other (non HMO) | Admitting: Obstetrics

## 2021-12-08 ENCOUNTER — Other Ambulatory Visit (HOSPITAL_COMMUNITY)
Admission: RE | Admit: 2021-12-08 | Discharge: 2021-12-08 | Disposition: A | Discharge status: Home / Self Care | Payer: BC Managed Care – PPO | Source: Ambulatory Visit | Attending: Obstetrics | Admitting: Obstetrics

## 2021-12-08 ENCOUNTER — Encounter: Payer: Self-pay | Admitting: Obstetrics

## 2021-12-08 ENCOUNTER — Other Ambulatory Visit: Payer: Self-pay | Admitting: Obstetrics

## 2021-12-08 ENCOUNTER — Ambulatory Visit (INDEPENDENT_AMBULATORY_CARE_PROVIDER_SITE_OTHER): Payer: BC Managed Care – PPO | Admitting: Obstetrics

## 2021-12-08 VITALS — BP 100/60 | Ht 60.0 in | Wt 85.0 lb

## 2021-12-08 DIAGNOSIS — Z01419 Encounter for gynecological examination (general) (routine) without abnormal findings: Secondary | ICD-10-CM | POA: Diagnosis not present

## 2021-12-08 DIAGNOSIS — Z113 Encounter for screening for infections with a predominantly sexual mode of transmission: Secondary | ICD-10-CM

## 2021-12-08 DIAGNOSIS — Z124 Encounter for screening for malignant neoplasm of cervix: Secondary | ICD-10-CM | POA: Diagnosis not present

## 2021-12-08 DIAGNOSIS — Z3040 Encounter for surveillance of contraceptives, unspecified: Secondary | ICD-10-CM

## 2021-12-08 DIAGNOSIS — B3731 Acute candidiasis of vulva and vagina: Secondary | ICD-10-CM | POA: Diagnosis not present

## 2021-12-08 DIAGNOSIS — A6004 Herpesviral vulvovaginitis: Secondary | ICD-10-CM

## 2021-12-08 MED ORDER — XULANE 150-35 MCG/24HR TD PTWK: 1.0000 | MEDICATED_PATCH | TRANSDERMAL | 4 refills | Status: DC

## 2021-12-08 MED ORDER — FLUCONAZOLE 150 MG PO TABS: 150.0000 mg | ORAL_TABLET | Freq: Every day | ORAL | 5 refills | Status: AC

## 2021-12-08 MED ORDER — VALACYCLOVIR HCL 500 MG PO TABS: 500.0000 mg | ORAL_TABLET | Freq: Every day | ORAL | 3 refills | Status: DC

## 2021-12-08 NOTE — Progress Notes (Signed)
Gynecology Annual Exam  PCP: [name], NP  Chief Complaint:  Chief Complaint  Patient presents with   Gynecologic Exam   Medication Refill    Bc patch    History of Present Illness:  [name] is a 21 y.o. 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 Looking forward to graduating in a year. She is in a new relationship, and shares that this partner is healthier for her. Using the patch for [facility] Her menses are regular every 28-30 days, lasting 4 day(s).  Dysmenorrhea none. She does not have intermenstrual bleeding.  She is single partner, contraception - Ortho-Evra patches weekly.  Last Pap: December 02, 2022  Results were: no abnormalities  Hx of STDs: chlamydia, HSV  There is no FH of breast cancer. There is no FH of ovarian cancer. The patient does not do self-breast exams.  Tobacco use: The patient denies current or previous tobacco use. Alcohol use: social drinker Exercise: moderately active    The patient wears seatbelts: yes.   The patient reports that domestic violence in her life is absent.   Past Medical History:  Diagnosis Date   Anxiety    BV (bacterial vaginosis)    Chest pain, exertional 05/11/2015   Chlamydia 2017-2020   x4   Chlamydia infection 08/15/2016   Positive Chlamydia on 08/08/2016 and 10/19/2016   Chronic cough 06/23/2020   Frequent headaches    Genital herpes 02/2020   type 2 on culture   UTI (lower urinary tract infection)    Viral gastroenteritis 02/01/2021    Past Surgical History:  Procedure Laterality Date   NO PAST SURGERIES      Prior to Admission medications   Medication Sig Start Date End Date Taking? Authorizing Provider  Ascorbic Acid (VITAMIN C) 500 MG CHEW Chew 500 mg by mouth daily.   Yes [provider]  glycopyrrolate (ROBINUL) 1 MG tablet Take 1 tablet (1 mg total) by mouth daily. Excessive sweating 10/21/21  Yes [name], NP  Multiple Vitamin (MULTIVITAMIN WITH  MINERALS) TABS tablet Take 1 tablet by mouth daily.   Yes [provider]  valACYclovir (VALTREX) 500 MG tablet Take 1 tablet (500 mg total) by mouth daily. 12/01/20  Yes Mirna Mires, CNM  Burr Medico 150-35 MCG/24HR transdermal patch APPLY 1 PATCH ONCE A WEEK 09/20/21  Yes Mirna Mires, CNM    Allergies  Allergen Reactions   Penicillins Hives    Gynecologic History: Patient's last menstrual period was 11/29/2021. 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    Enjoys spending time with friends.   Will be starting a job with [employer] in August 2019   Social Determinants of Health   Financial Resource Strain: Not on file  Food Insecurity: Not on file  Transportation Needs: Not on file  Physical Activity: Not on file  Stress: Not on file  Social Connections: Not on file  Intimate Partner Violence: Not on file    Family History  Problem Relation Age of Onset   Arthritis Maternal Grandmother    Stroke Maternal Grandmother    Hypertension Maternal Grandmother    Diabetes Maternal Grandmother    Hypercholesterolemia Maternal Grandmother    Hypertension Maternal  Grandfather    Hypercholesterolemia Maternal Grandfather    Other Mother        acid reflux    Review of Systems  Constitutional: Negative.   HENT: Negative.    Eyes: Negative.   Respiratory: Negative.    Cardiovascular: Negative.   Gastrointestinal: Negative.   Genitourinary: Negative.   Musculoskeletal: Negative.   Skin: Negative.   Neurological: Negative.   Endo/Heme/Allergies: Negative.   Psychiatric/Behavioral: Negative.       Physical Exam BP 100/60   Ht 5' (1.524 m)   Wt 85 lb (38.6 kg)   LMP 11/29/2021   BMI 16.60 kg/m    Physical Exam Constitutional:      Appearance: Normal appearance.     Comments: BMI is low. She is Lactose intolerant. Has had wt check and labs by her PCP. Not a big eater.  Genitourinary:     Vulva and rectum normal.     Genitourinary Comments: No external lesions. Normal vaginal mucosa and rugae. Cervix is nulliparous in appearance. No malodor or discharge noted.  HENT:     Head: Normocephalic and atraumatic.  Cardiovascular:     Rate and Rhythm: Normal rate and regular rhythm.     Pulses: Normal pulses.     Heart sounds: Normal heart sounds.  Pulmonary:     Effort: Pulmonary effort is normal.     Breath sounds: Normal breath sounds.  Abdominal:     General: Abdomen is flat.  Musculoskeletal:        General: Normal range of motion.     Cervical back: Normal range of motion and neck supple.  Neurological:     General: No focal deficit present.     Mental Status: She is alert and oriented to person, place, and time.  Skin:    General: Skin is warm and dry.  Psychiatric:        Mood and Affect: Mood normal.        Behavior: Behavior normal.     Female chaperone present for pelvic and breast  portions of the physical exam She requests STI testing, including blood work  Results: AUDIT Questionnaire (screen for alcoholism): NA PHQ-9: 6 Her GAD is 10   Assessment: 21 y.o. G0P0000 female here for routine annual gynecologic  examination Low BMI STI screening   Plan: Problem List Items Addressed This Visit   None Visit Diagnoses     Cervical cancer screening    -  Primary   Relevant Orders   Cytology - PAP   Women's annual routine gynecological examination       Relevant Orders   Cytology - PAP   Cervicovaginal ancillary only   Screen for STD (sexually transmitted disease)  Relevant Orders   Cervicovaginal ancillary only       Screening: -- Blood pressure screen normal -- Weight screening:  She is underweight- her PCP has been following this -- Depression screening negative (PHQ-9) -- Nutrition: normal -- cholesterol screening: not due for screening -- osteoporosis screening: not due -- tobacco screening:  not discussed but noted that she is now smoking -- alcohol screening: AUDIT questionnaire indicates low-risk usage. -- family history of breast cancer screening: done. not at high risk. -- no evidence of domestic violence or intimate partner violence. -- STD screening: gonorrhea/chlamydia NAAT collected -- pap smear collected per ASCCP guidelines -- flu vaccine  not given -- HPV vaccination series: received  Mirna Mires, CNM  12/08/2021 1:48 PM   12/08/2021 1:48 PM

## 2021-12-09 LAB — HEP, RPR, HIV PANEL
HIV Screen 4th Generation wRfx: NONREACTIVE
Hepatitis B Surface Ag: NEGATIVE
RPR Ser Ql: NONREACTIVE

## 2021-12-13 ENCOUNTER — Encounter: Payer: Self-pay | Admitting: Obstetrics

## 2021-12-14 ENCOUNTER — Encounter: Payer: Self-pay | Admitting: Obstetrics

## 2021-12-14 LAB — CERVICOVAGINAL ANCILLARY ONLY
Bacterial Vaginitis (gardnerella): NEGATIVE
Chlamydia: NEGATIVE
Comment: NEGATIVE
Comment: NEGATIVE
Comment: NEGATIVE
Comment: NORMAL
Neisseria Gonorrhea: NEGATIVE
Trichomonas: NEGATIVE

## 2021-12-14 LAB — CYTOLOGY - PAP: Diagnosis: NEGATIVE

## 2022-01-29 ENCOUNTER — Other Ambulatory Visit: Payer: Self-pay | Admitting: Primary Care

## 2022-01-29 DIAGNOSIS — L74510 Primary focal hyperhidrosis, axilla: Secondary | ICD-10-CM

## 2022-01-29 NOTE — Telephone Encounter (Signed)
Please call patient:  Is she still using glycopyrrolate daily for sweating? Received refill request.

## 2022-01-30 NOTE — Telephone Encounter (Signed)
Unable to reach patient. Left voicemail to return call to our office.   

## 2022-01-30 NOTE — Telephone Encounter (Signed)
Noted. Refill(s) sent to pharmacy.  

## 2022-01-30 NOTE — Telephone Encounter (Signed)
Spoke to patient, she is using medication daily and is requesting a refill.

## 2022-03-07 ENCOUNTER — Other Ambulatory Visit: Payer: Self-pay

## 2022-03-07 ENCOUNTER — Encounter: Payer: Self-pay | Admitting: Obstetrics

## 2022-03-07 DIAGNOSIS — Z3040 Encounter for surveillance of contraceptives, unspecified: Secondary | ICD-10-CM

## 2022-03-07 MED ORDER — XULANE 150-35 MCG/24HR TD PTWK: 1.0000 | MEDICATED_PATCH | TRANSDERMAL | 4 refills | Status: DC

## 2022-03-27 ENCOUNTER — Other Ambulatory Visit: Payer: Self-pay

## 2022-03-27 ENCOUNTER — Encounter: Payer: Self-pay | Admitting: Obstetrics

## 2022-03-27 DIAGNOSIS — Z3040 Encounter for surveillance of contraceptives, unspecified: Secondary | ICD-10-CM

## 2022-03-27 MED ORDER — XULANE 150-35 MCG/24HR TD PTWK: 1.0000 | MEDICATED_PATCH | TRANSDERMAL | 0 refills | Status: DC

## 2022-04-18 ENCOUNTER — Encounter: Payer: Self-pay | Admitting: Internal Medicine

## 2022-04-18 ENCOUNTER — Ambulatory Visit (INDEPENDENT_AMBULATORY_CARE_PROVIDER_SITE_OTHER): Payer: BC Managed Care – PPO | Admitting: Internal Medicine

## 2022-04-18 VITALS — BP 98/76 | HR 104 | Temp 98.4°F | Ht 60.0 in | Wt 89.0 lb

## 2022-04-18 DIAGNOSIS — R509 Fever, unspecified: Secondary | ICD-10-CM

## 2022-04-18 DIAGNOSIS — J069 Acute upper respiratory infection, unspecified: Secondary | ICD-10-CM

## 2022-04-18 HISTORY — DX: Acute upper respiratory infection, unspecified: J06.9

## 2022-04-18 LAB — POC COVID19 BINAXNOW: SARS Coronavirus 2 Ag: NEGATIVE

## 2022-04-18 NOTE — Assessment & Plan Note (Signed)
Seems to have been mild Probably got it with travel--?low inoculum Discussed rest, supportive care prn

## 2022-04-18 NOTE — Progress Notes (Signed)
   Subjective:    Patient ID: Krista Flores, female    DOB: 01/01/2001, 22 y.o.   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     glycopyrrolate (ROBINUL) 1 MG tablet TAKE 1 TABLET (1 MG TOTAL) BY MOUTH DAILY. EXCESSIVE SWEATING 90 tablet 0   Multiple Vitamin (MULTIVITAMIN WITH MINERALS) TABS tablet Take 1 tablet by mouth daily.     valACYclovir (VALTREX) 500 MG tablet Take 1 tablet (500 mg total) by mouth daily. 90 tablet 3   XULANE 150-35 MCG/24HR transdermal patch Place 1 patch onto the skin once a week. 9 patch 0   No current facility-administered medications on file prior to visit.    Allergies  Allergen Reactions   Penicillins Hives    Past Medical History:  Diagnosis Date   Anxiety    BV (bacterial vaginosis)    Chest pain, exertional 05/11/2015   Chlamydia 2017-2020   x4   Chlamydia infection 08/15/2016   Positive Chlamydia on 08/08/2016 and 10/19/2016   Chronic cough 06/23/2020   Frequent headaches    Genital herpes 02/2020   type 2 on culture   UTI (lower urinary tract infection)    Viral gastroenteritis 02/01/2021    Past Surgical History:  Procedure Laterality Date   NO PAST SURGERIES        Review of Systems No recent vaccinations for COVID, never for flu No N/V Eating okay    Objective:   Physical Exam Constitutional:      Appearance: Normal appearance.  HENT:     Mouth/Throat:     Pharynx: No oropharyngeal exudate or posterior oropharyngeal erythema.  Pulmonary:     Effort: Pulmonary effort  is normal.     Breath sounds: Normal breath sounds. No wheezing or rales.  Musculoskeletal:     Cervical back: Neck supple.  Lymphadenopathy:     Cervical: No cervical adenopathy.  Neurological:     Mental Status: She is alert.            Assessment & Plan:

## 2022-05-07 ENCOUNTER — Other Ambulatory Visit: Payer: Self-pay | Admitting: Primary Care

## 2022-05-07 DIAGNOSIS — L74510 Primary focal hyperhidrosis, axilla: Secondary | ICD-10-CM

## 2022-06-19 ENCOUNTER — Other Ambulatory Visit: Payer: Self-pay | Admitting: Obstetrics

## 2022-06-19 DIAGNOSIS — Z3040 Encounter for surveillance of contraceptives, unspecified: Secondary | ICD-10-CM

## 2022-07-21 ENCOUNTER — Other Ambulatory Visit: Payer: Self-pay | Admitting: Obstetrics

## 2022-07-21 DIAGNOSIS — Z3040 Encounter for surveillance of contraceptives, unspecified: Secondary | ICD-10-CM

## 2022-07-24 MED ORDER — XULANE 150-35 MCG/24HR TD PTWK: 1.0000 | MEDICATED_PATCH | TRANSDERMAL | 0 refills | Status: DC

## 2022-08-18 ENCOUNTER — Other Ambulatory Visit: Payer: Self-pay | Admitting: Obstetrics

## 2022-08-18 ENCOUNTER — Encounter: Payer: Self-pay | Admitting: Primary Care

## 2022-08-18 ENCOUNTER — Ambulatory Visit (INDEPENDENT_AMBULATORY_CARE_PROVIDER_SITE_OTHER): Payer: BC Managed Care – PPO | Admitting: Primary Care

## 2022-08-18 ENCOUNTER — Other Ambulatory Visit: Payer: Self-pay | Admitting: Primary Care

## 2022-08-18 VITALS — BP 104/72 | HR 105 | Temp 98.6°F | Ht 60.0 in | Wt 87.0 lb

## 2022-08-18 DIAGNOSIS — R519 Headache, unspecified: Secondary | ICD-10-CM

## 2022-08-18 DIAGNOSIS — F411 Generalized anxiety disorder: Secondary | ICD-10-CM | POA: Diagnosis not present

## 2022-08-18 DIAGNOSIS — Z3040 Encounter for surveillance of contraceptives, unspecified: Secondary | ICD-10-CM

## 2022-08-18 DIAGNOSIS — R5383 Other fatigue: Secondary | ICD-10-CM | POA: Insufficient documentation

## 2022-08-18 DIAGNOSIS — R631 Polydipsia: Secondary | ICD-10-CM

## 2022-08-18 LAB — CBC
HCT: 37.7 % (ref 36.0–46.0)
Hemoglobin: 12.5 g/dL (ref 12.0–15.0)
MCHC: 33.2 g/dL (ref 30.0–36.0)
MCV: 93.2 fl (ref 78.0–100.0)
Platelets: 267 10*3/uL (ref 150.0–400.0)
RBC: 4.05 Mil/uL (ref 3.87–5.11)
RDW: 13.4 % (ref 11.5–15.5)
WBC: 5 10*3/uL (ref 4.0–10.5)

## 2022-08-18 LAB — POCT GLYCOSYLATED HEMOGLOBIN (HGB A1C): Hemoglobin A1C: 5.1 % (ref 4.0–5.6)

## 2022-08-18 LAB — BASIC METABOLIC PANEL
BUN: 11 mg/dL (ref 6–23)
CO2: 27 mEq/L (ref 19–32)
Calcium: 9.1 mg/dL (ref 8.4–10.5)
Chloride: 101 mEq/L (ref 96–112)
Creatinine, Ser: 0.63 mg/dL (ref 0.40–1.20)
GFR: 124.62 mL/min (ref >60.00)
Glucose, Bld: 86 mg/dL (ref 70–99)
Potassium: 4 mEq/L (ref 3.5–5.1)
Sodium: 134 mEq/L — ABNORMAL LOW (ref 135–145)

## 2022-08-18 LAB — IBC + FERRITIN
Ferritin: 14.1 ng/mL (ref 10.0–291.0)
Iron: 178 ug/dL — ABNORMAL HIGH (ref 42–145)
Saturation Ratios: 37.7 % (ref 20.0–50.0)
TIBC: 471.8 ug/dL — ABNORMAL HIGH (ref 250.0–450.0)
Transferrin: 337 mg/dL (ref 212.0–360.0)

## 2022-08-18 LAB — TSH: TSH: 1.17 u[IU]/mL (ref 0.35–5.50)

## 2022-08-18 MED ORDER — XULANE 150-35 MCG/24HR TD PTWK
1.0000 | MEDICATED_PATCH | TRANSDERMAL | 2 refills | Status: DC
Start: 2022-08-18 — End: 2022-11-13

## 2022-08-18 MED ORDER — PROPRANOLOL HCL ER 60 MG PO CP24
60.0000 mg | ORAL_CAPSULE | Freq: Every day | ORAL | 0 refills | Status: DC
Start: 2022-08-18 — End: 2022-10-25

## 2022-08-18 NOTE — Assessment & Plan Note (Signed)
Uncontrolled.  In an attempt for better control, and to avoid recurrent use of Aleve and Aspirin, will treat with daily preventative medication.   Recommended MR brain as she's never completed. She will discuss with her mother and update.  Start propranolol ER 60 mg HS for prevention of headaches and her anxiety.  Follow up in 1 month.

## 2022-08-18 NOTE — Assessment & Plan Note (Signed)
Discussed to cut back on sugary drinks and caffeine. A1C today of 5.1.  Other labs pending.

## 2022-08-18 NOTE — Patient Instructions (Signed)
Stop by the lab prior to leaving today. I will notify you of your results once received.   Cut back on sugary drinks such as juice and sweet tea. Increase water intake.  Start propranolol ER 60 mg HS for anxiety and headache prevention.  You will either be contacted via phone regarding your referral to therapy, or you may receive a letter on your MyChart portal from our referral team with instructions for scheduling an appointment. Please let us know if you have not been contacted by anyone within two weeks.  Discuss the MRI of your brain with your mom and let me know.  Schedule a visit for 1 month for headaches and anxiety.  It was a pleasure to see you today!

## 2022-08-18 NOTE — Assessment & Plan Note (Signed)
Iron studies, CBC, and thyroid studies pending. Will also work to treat anxiety.

## 2022-08-18 NOTE — Progress Notes (Signed)
Subjective:    Patient ID: Krista Flores, female    DOB: [birth year], 22 y.o.   MRN: [id number]  HPI  Krista Flores is a very pleasant 22 y.o. female with a history of migraines, hyperhidrosis, GAD, underweight who presents today to discuss fatigue and multiple symptoms.    Over the last 1-2 weeks she's felt increased fatigue, increased thirst, increased hunger, hot flashes and cold flashes. She's concerned she may have diabetes.   She has a family history of type 2 diabetes in her maternal grandmother. She mostly drinks lemonade, fruit punch, juice, and a lot of sweet tea. She hardly drinks water.   She does have chronic anxiety with symptoms of palpitations, tearfulness, frequent worrying, feeling anxious/nervous, easily annoyed, palms sweating. Symptoms have waxed and waned over the years, but are daily. She has never undergone treatment for anxiety.   Her menstrual cycles are lighter since she began birth control.   She continues to experience frequent headaches that occur weekly lasting several days. Located to the left occipital lobe without movement. She does notice photophobia at times. Migraines are overall infrequent. She's taking Aleve and Aspirin multiple times weekly for headaches.   Wt Readings from Last 3 Encounters:  08/18/22 87 lb (39.5 kg)  04/18/22 89 lb (40.4 kg)  12/08/21 85 lb (38.6 kg)      Review of Systems  Constitutional:  Positive for fatigue.  Respiratory:  Negative for shortness of breath.   Cardiovascular:  Negative for palpitations.  Endocrine: Positive for polydipsia and polyphagia.  Neurological:  Positive for light-headedness and headaches. Negative for numbness.  Psychiatric/Behavioral:  The patient is nervous/anxious.          Past Medical History:  Diagnosis Date   Anxiety    BV (bacterial vaginosis)    Chest pain, exertional 05/11/2015   Chlamydia 2017-2020   x4   Chlamydia infection 08/15/2016   Positive Chlamydia on 08/08/2016  and 10/19/2016   Chronic cough 06/23/2020   Frequent headaches    Genital herpes 02/2020   type 2 on culture   UTI (lower urinary tract infection)    Viral gastroenteritis 02/01/2021   Viral URI 04/18/2022    Social History   Socioeconomic History   Marital status: Single    Spouse name: Not on file   Number of children: 0   Years of education: 12   Highest education level: Not on file  Occupational History   Occupation: Conservation officer, 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    Enjoys spending time with friends.   Will be starting a job with [employer] in August 2019   Social Determinants of Health   Financial Resource Strain: Not on file  Food Insecurity: Not on file  Transportation Needs: Not on file  Physical Activity: Not on file  Stress:  Not on file  Social Connections: Not on file  Intimate Partner Violence: Not on file    Past Surgical History:  Procedure Laterality Date   NO PAST SURGERIES      Family History  Problem Relation Age of Onset   Arthritis Maternal Grandmother    Stroke Maternal Grandmother    Hypertension Maternal Grandmother    Diabetes Maternal Grandmother    Hypercholesterolemia Maternal Grandmother    Hypertension Maternal Grandfather    Hypercholesterolemia Maternal Grandfather    Other Mother        acid reflux    Allergies  Allergen Reactions   Penicillins Hives    Current Outpatient Medications on File Prior to  Visit  Medication Sig Dispense Refill   Ascorbic Acid (VITAMIN C) 500 MG CHEW Chew 500 mg by mouth daily.     glycopyrrolate (ROBINUL) 1 MG tablet TAKE 1 TABLET (1 MG TOTAL) BY MOUTH DAILY. EXCESSIVE SWEATING 90 tablet 0   Multiple Vitamin (MULTIVITAMIN WITH MINERALS) TABS tablet Take 1 tablet by mouth daily.     valACYclovir (VALTREX) 500 MG tablet Take 1 tablet (500 mg total) by mouth daily. 90 tablet 3   XULANE 150-35 MCG/24HR transdermal patch Place 1 patch onto the skin once a week. 9 patch 0   No current facility-administered medications on file prior to visit.    BP 104/72   Pulse (!) 105   Temp 98.6 F (37 C) (Temporal)   Ht 5' (1.524 m)   Wt 87 lb (39.5 kg)   LMP 08/13/2022 (Exact Date)   SpO2 99%   BMI 16.99 kg/m  Objective:   Physical Exam Cardiovascular:     Rate and Rhythm: Regular rhythm. Tachycardia present.  Pulmonary:     Effort: Pulmonary effort is normal.     Breath sounds: Normal breath sounds.  Musculoskeletal:     Cervical back: Neck supple.  Skin:    General: Skin is warm and dry.  Neurological:     Mental Status: She is alert and oriented to person, place, and time.           Assessment & Plan:  Polydipsia Assessment & Plan: Discussed to cut back on sugary drinks and caffeine. A1C today of 5.1.  Other labs pending.  Orders: -     POCT glycosylated hemoglobin (Hb A1C) -     Basic metabolic panel  GAD (generalized anxiety disorder) Assessment & Plan: Suspect symptoms are secondary to anxiety, A1C today of 5.1.  She is open to treating her anxiety.  Referral placed for therapy. Start propranolol ER 60 mg HS for headache prevention and physical anxiety symptoms.  Follow up in 1 month.  Orders: -     Ambulatory referral to Psychology -     Propranolol HCl ER; Take 1 capsule (60 mg total) by mouth daily. For headache prevention and anxiety  Dispense: 30 capsule; Refill: 0  Frequent headaches Assessment & Plan: Uncontrolled.  In  an attempt for better control, and to avoid recurrent use of Aleve and Aspirin, will treat with daily preventative medication.   Recommended MR brain as she's never completed. She will discuss with her mother and update.  Start propranolol ER 60 mg HS for prevention of headaches and her anxiety.  Follow up in 1 month.  Orders: -     Propranolol HCl ER; Take 1 capsule (60 mg total) by mouth daily. For headache prevention and anxiety  Dispense: 30 capsule; Refill: 0  Other fatigue Assessment &  Plan: Iron studies, CBC, and thyroid studies pending. Will also work to treat anxiety.  Orders: -     TSH -     CBC -     IBC + Ferritin -     Basic metabolic panel        [name], NP

## 2022-08-18 NOTE — Assessment & Plan Note (Signed)
Suspect symptoms are secondary to anxiety, A1C today of 5.1.  She is open to treating her anxiety.  Referral placed for therapy. Start propranolol ER 60 mg HS for headache prevention and physical anxiety symptoms.  Follow up in 1 month.

## 2022-08-19 ENCOUNTER — Encounter: Payer: Self-pay | Admitting: Obstetrics

## 2022-08-21 ENCOUNTER — Encounter: Payer: Self-pay | Admitting: *Deleted

## 2022-09-09 ENCOUNTER — Other Ambulatory Visit: Payer: Self-pay | Admitting: Primary Care

## 2022-09-09 DIAGNOSIS — F411 Generalized anxiety disorder: Secondary | ICD-10-CM

## 2022-09-09 DIAGNOSIS — R519 Headache, unspecified: Secondary | ICD-10-CM

## 2022-09-10 NOTE — Telephone Encounter (Signed)
Please call patient:  Last month we prescribed a medication called propranolol ER for headache prevention and anxiety. Is this helping with her headaches?  If so, we will refill it.   Also, please remind her to follow up with psychiatry.

## 2022-09-11 NOTE — Telephone Encounter (Signed)
Called and spoke with patient, she stated she has not started taking the medication, she stated she wanted to wait as long as possibly until starting a medication and being dependent. Patient does not need refill  Reminded patient to f/u with psychiatry, patient stated she would.

## 2022-09-11 NOTE — Telephone Encounter (Signed)
Noted. Will decline refill request.  

## 2022-10-17 ENCOUNTER — Encounter: Payer: Self-pay | Admitting: Obstetrics

## 2022-10-25 ENCOUNTER — Ambulatory Visit (INDEPENDENT_AMBULATORY_CARE_PROVIDER_SITE_OTHER): Payer: BC Managed Care – PPO | Admitting: Primary Care

## 2022-10-25 ENCOUNTER — Encounter: Payer: Self-pay | Admitting: Primary Care

## 2022-10-25 VITALS — BP 98/74 | HR 97 | Temp 97.1°F | Ht 61.0 in | Wt 87.0 lb

## 2022-10-25 DIAGNOSIS — R636 Underweight: Secondary | ICD-10-CM | POA: Diagnosis not present

## 2022-10-25 DIAGNOSIS — A6004 Herpesviral vulvovaginitis: Secondary | ICD-10-CM

## 2022-10-25 DIAGNOSIS — Z0000 Encounter for general adult medical examination without abnormal findings: Secondary | ICD-10-CM

## 2022-10-25 DIAGNOSIS — L74510 Primary focal hyperhidrosis, axilla: Secondary | ICD-10-CM | POA: Diagnosis not present

## 2022-10-25 DIAGNOSIS — R519 Headache, unspecified: Secondary | ICD-10-CM

## 2022-10-25 DIAGNOSIS — F411 Generalized anxiety disorder: Secondary | ICD-10-CM

## 2022-10-25 LAB — CBC
HCT: 37.2 % (ref 36.0–46.0)
Hemoglobin: 12.2 g/dL (ref 12.0–15.0)
MCHC: 32.8 g/dL (ref 30.0–36.0)
MCV: 92.4 fl (ref 78.0–100.0)
Platelets: 282 10*3/uL (ref 150.0–400.0)
RBC: 4.03 Mil/uL (ref 3.87–5.11)
RDW: 13.3 % (ref 11.5–15.5)
WBC: 4.4 10*3/uL (ref 4.0–10.5)

## 2022-10-25 LAB — IBC + FERRITIN
Ferritin: 8 ng/mL — ABNORMAL LOW (ref 10.0–291.0)
Iron: 110 ug/dL (ref 42–145)
Saturation Ratios: 23.8 % (ref 20.0–50.0)
TIBC: 462 ug/dL — ABNORMAL HIGH (ref 250.0–450.0)
Transferrin: 330 mg/dL (ref 212.0–360.0)

## 2022-10-25 NOTE — Assessment & Plan Note (Signed)
Controlled.   Continue Valtrex 500 mg daily. Follows with GYN

## 2022-10-25 NOTE — Assessment & Plan Note (Signed)
Weight is stable.  No alarm signs. Repeat CBC and iron studies.

## 2022-10-25 NOTE — Patient Instructions (Signed)
Stop by the lab prior to leaving today. I will notify you of your results once received.   It was a pleasure to see you today!  

## 2022-10-25 NOTE — Assessment & Plan Note (Signed)
Controlled and no concerns today.  She never initiated propranolol so we will discontinue.

## 2022-10-25 NOTE — Assessment & Plan Note (Signed)
Controlled.   Continue glycopyrrolate 1 mg daily, especially during summer months.

## 2022-10-25 NOTE — Assessment & Plan Note (Signed)
Immunizations UTD. Pap smear UTD  Discussed the importance of a healthy diet and regular exercise in order for weight loss, and to reduce the risk of further co-morbidity.  Exam stable. Labs pending.  Follow up in 1 year for repeat physical.  

## 2022-10-25 NOTE — Assessment & Plan Note (Signed)
Controlled and no concerns today. Never started propranolol.  Will discontinue propranolol for medication list. She will update if symptoms return.

## 2022-10-25 NOTE — Progress Notes (Signed)
Subjective:    Patient ID: Krista Flores, female    DOB: 06-23-2000, 22 y.o.   MRN: [id number]  HPI  Krista Flores is a very pleasant 22 y.o. female who presents today for complete physical and follow up of chronic conditions.  Immunizations: -Tetanus: Completed in 2021 -HPV: Completed series  Diet: Fair diet.  Exercise: No regular exercise.   Eye exam: Completed several years ago  Dental exam: Completes semi-annually    Pap Smear: Completed in 2023 per GYN  BP Readings from Last 3 Encounters:  10/25/22 98/74  08/18/22 104/72  04/18/22 98/76   Wt Readings from Last 3 Encounters:  10/25/22 87 lb (39.5 kg)  08/18/22 87 lb (39.5 kg)  04/18/22 89 lb (40.4 kg)       Review of Systems  Constitutional:  Negative for unexpected weight change.  HENT:  Negative for rhinorrhea.   Respiratory:  Negative for cough and shortness of breath.   Cardiovascular:  Negative for chest pain.  Gastrointestinal:  Negative for constipation and diarrhea.  Genitourinary:  Negative for difficulty urinating and menstrual problem.  Musculoskeletal:  Negative for arthralgias and myalgias.  Skin:  Negative for rash.  Allergic/Immunologic: Negative for environmental allergies.  Neurological:  Negative for dizziness, numbness and headaches.  Psychiatric/Behavioral:  The patient is not nervous/anxious.          Past Medical History:  Diagnosis Date   Anxiety    BV (bacterial vaginosis)    Chest pain, exertional 05/11/2015   Chlamydia 2017-2020   x4   Chlamydia infection 08/15/2016   Positive Chlamydia on 08/08/2016 and 10/19/2016   Chronic cough 06/23/2020   Frequent headaches    Genital herpes 02/2020   type 2 on culture   Midline thoracic back pain 10/29/2014   Polydipsia 06/25/2019   UTI (lower urinary tract infection)    Viral gastroenteritis 02/01/2021   Viral URI 04/18/2022    Social History   Socioeconomic History   Marital status: Single    Spouse name: Not on file    Number of children: 0   Years of education: 12   Highest education level: Not on file  Occupational History   Occupation: Conservation officer, 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    Enjoys spending time with friends.   Will be starting a job with [employer] in August 2019   Social Determinants of Health   Financial Resource Strain: Not on file  Food Insecurity: Not on file  Transportation Needs: Not on file  Physical Activity: Not on file  Stress: Not on file  Social Connections: Not on file  Intimate Partner Violence: Not on file    Past Surgical History:  Procedure Laterality Date   NO PAST SURGERIES      Family History  Problem Relation Age of Onset  Arthritis Maternal Grandmother    Stroke Maternal Grandmother    Hypertension Maternal Grandmother    Diabetes Maternal Grandmother    Hypercholesterolemia Maternal Grandmother    Hypertension Maternal Grandfather    Hypercholesterolemia Maternal Grandfather    Other Mother        acid reflux    Allergies  Allergen Reactions   Penicillins Hives    Current Outpatient Medications on File Prior to Visit  Medication Sig Dispense Refill   Ascorbic Acid (VITAMIN C) 500 MG CHEW Chew 500 mg by mouth daily.     glycopyrrolate (ROBINUL) 1 MG tablet TAKE 1 TABLET (1 MG TOTAL) BY MOUTH DAILY. EXCESSIVE SWEATING 90 tablet 0   Multiple Vitamin (MULTIVITAMIN WITH MINERALS) TABS tablet Take 1 tablet by mouth  daily.     valACYclovir (VALTREX) 500 MG tablet Take 1 tablet (500 mg total) by mouth daily. 90 tablet 3   XULANE 150-35 MCG/24HR transdermal patch Place 1 patch onto the skin once a week. 9 patch 2   No current facility-administered medications on file prior to visit.    BP 98/74   Pulse 97   Temp (!) 97.1 F (36.2 C) (Temporal)   Ht 5\' 1"  (1.549 m)   Wt 87 lb (39.5 kg)   LMP 10/03/2022   SpO2 99%   BMI 16.44 kg/m  Objective:   Physical Exam HENT:     Right Ear: Tympanic membrane and ear canal normal.     Left Ear: Tympanic membrane and ear canal normal.     Nose: Nose normal.  Eyes:     Conjunctiva/sclera: Conjunctivae normal.     Pupils: Pupils are equal, round, and reactive to light.  Neck:     Thyroid: No thyromegaly.  Cardiovascular:     Rate and Rhythm: Normal rate and regular rhythm.     Heart sounds: No murmur heard. Pulmonary:     Effort: Pulmonary effort is normal.     Breath sounds: Normal breath sounds. No rales.  Abdominal:     General: Bowel sounds are normal.     Palpations: Abdomen is soft.     Tenderness: There is no abdominal tenderness.  Musculoskeletal:        General: Normal range of motion.     Cervical back: Neck supple.  Lymphadenopathy:     Cervical: No cervical adenopathy.  Skin:    General: Skin is warm and dry.     Findings: No rash.  Neurological:     Mental Status: She is alert and oriented to person, place, and time.     Cranial Nerves: No cranial nerve deficit.     Deep Tendon Reflexes: Reflexes are normal and symmetric.  Psychiatric:        Mood and Affect: Mood normal.           Assessment & Plan:  Preventative health care Assessment & Plan: Immunizations UTD. Pap smear UTD.  Discussed the importance of a healthy diet and regular exercise in order for weight loss, and to reduce the risk of further co-morbidity.  Exam stable. Labs pending.  Follow up in 1 year for repeat physical.    Hyperhidrosis of  axilla Assessment & Plan: Controlled.   Continue glycopyrrolate 1 mg daily, especially during summer months.   Herpes simplex vulvovaginitis Assessment & Plan: Controlled.  Continue Valtrex 500 mg daily. Follows with GYN.   Frequent headaches Assessment & Plan: Controlled and no concerns today. Never started propranolol.  Will discontinue propranolol for medication  list. She will update if symptoms return.   GAD (generalized anxiety disorder) Assessment & Plan: Controlled and no concerns today.  She never initiated propranolol so we will discontinue.   Underweight due to inadequate caloric intake Assessment & Plan: Weight is stable.  No alarm signs. Repeat CBC and iron studies.  Orders: -     IBC + Ferritin -     CBC        [name], NP

## 2022-11-13 ENCOUNTER — Other Ambulatory Visit: Payer: Self-pay | Admitting: Obstetrics

## 2022-11-13 DIAGNOSIS — Z3040 Encounter for surveillance of contraceptives, unspecified: Secondary | ICD-10-CM

## 2022-12-13 ENCOUNTER — Ambulatory Visit: Payer: BC Managed Care – PPO | Admitting: Obstetrics

## 2022-12-13 ENCOUNTER — Other Ambulatory Visit (HOSPITAL_COMMUNITY)
Admission: RE | Admit: 2022-12-13 | Discharge: 2022-12-13 | Disposition: A | Discharge status: Home / Self Care | Payer: BC Managed Care – PPO | Source: Ambulatory Visit | Attending: Obstetrics | Admitting: Obstetrics

## 2022-12-13 ENCOUNTER — Encounter: Payer: Self-pay | Admitting: Obstetrics

## 2022-12-13 ENCOUNTER — Telehealth: Payer: Self-pay

## 2022-12-13 ENCOUNTER — Other Ambulatory Visit: Payer: Self-pay | Admitting: Obstetrics

## 2022-12-13 VITALS — BP 100/67 | HR 91 | Ht 60.0 in | Wt 87.0 lb

## 2022-12-13 DIAGNOSIS — Z124 Encounter for screening for malignant neoplasm of cervix: Secondary | ICD-10-CM | POA: Diagnosis present

## 2022-12-13 DIAGNOSIS — Z113 Encounter for screening for infections with a predominantly sexual mode of transmission: Secondary | ICD-10-CM

## 2022-12-13 DIAGNOSIS — Z3040 Encounter for surveillance of contraceptives, unspecified: Secondary | ICD-10-CM

## 2022-12-13 DIAGNOSIS — A6004 Herpesviral vulvovaginitis: Secondary | ICD-10-CM

## 2022-12-13 DIAGNOSIS — N6321 Unspecified lump in the left breast, upper outer quadrant: Secondary | ICD-10-CM | POA: Insufficient documentation

## 2022-12-13 DIAGNOSIS — Z01419 Encounter for gynecological examination (general) (routine) without abnormal findings: Secondary | ICD-10-CM | POA: Diagnosis present

## 2022-12-13 DIAGNOSIS — B3731 Acute candidiasis of vulva and vagina: Secondary | ICD-10-CM

## 2022-12-13 DIAGNOSIS — N63 Unspecified lump in unspecified breast: Secondary | ICD-10-CM

## 2022-12-13 DIAGNOSIS — N6311 Unspecified lump in the right breast, upper outer quadrant: Secondary | ICD-10-CM

## 2022-12-13 HISTORY — DX: Unspecified lump in the left breast, upper outer quadrant: N63.21

## 2022-12-13 MED ORDER — FLUCONAZOLE 150 MG PO TABS
150.0000 mg | ORAL_TABLET | ORAL | 4 refills | Status: AC
Start: 2022-12-13 — End: 2023-01-04

## 2022-12-13 MED ORDER — XULANE 150-35 MCG/24HR TD PTWK
1.0000 | MEDICATED_PATCH | TRANSDERMAL | 4 refills | Status: DC
Start: 2022-12-13 — End: 2023-12-17

## 2022-12-13 MED ORDER — VALACYCLOVIR HCL 500 MG PO TABS
500.0000 mg | ORAL_TABLET | Freq: Every day | ORAL | 3 refills | Status: DC
Start: 2022-12-13 — End: 2023-12-17

## 2022-12-13 NOTE — Telephone Encounter (Signed)
Pt calling; was seen this morning for left breast exam; MMF put in order; Mammogram people said it was for the right breast.  Can this please be changed?  [phone]

## 2022-12-13 NOTE — Progress Notes (Signed)
Gynecology Annual Exam  PCP: [name], NP  Chief Complaint:  Chief Complaint  Patient presents with   Gynecologic Exam    No concerns    History of Present Illness:  [name] is a 22 y.o. G1P0010 who LMP was Patient's last menstrual period was 11/29/2022., presents today for her annual examination.  Her menses are regular every 28-30 days, lasting 4 day(s).  Dysmenorrhea none. She does not have intermenstrual bleeding. 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 She uses the patch for [facility], and is happy with the method. Hx of chlamydia and HSV. Takes Valtrex for suppressive therapy. Is in a relationship with one man for > a year now. Has had low BMI for a number of years, and this has been addressed by her PCP.  She is single partner, contraception - Ortho-Evra patches weekly.  Last Pap: June 31, 2023  Results were: no abnormalities /neg HPV Hx of STDs: chlamydia, HSV  There is no FH of breast cancer. There is no FH of ovarian cancer. The patient does not do self-breast exams.  Tobacco use:  she is a chronic marijuana smoker. Alcohol use: none Exercise: moderately active   The patient wears seatbelts: yes.   The patient reports that domestic violence in her life is absent.   Past Medical History:  Diagnosis Date   Anxiety    BV (bacterial vaginosis)    Chest pain, exertional 05/11/2015   Chlamydia 2017-2020   x4   Chlamydia infection 08/15/2016   Positive Chlamydia on 08/08/2016 and 10/19/2016   Chronic cough 06/23/2020   Frequent headaches    Genital herpes 02/2020   type 2 on culture   Midline thoracic back pain 10/29/2014   Polydipsia 06/25/2019   UTI (lower urinary tract infection)    Viral gastroenteritis 02/01/2021   Viral URI 04/18/2022    Past Surgical History:  Procedure Laterality Date   WISDOM TOOTH EXTRACTION      Prior to Admission medications   Medication Sig Start Date End  Date Taking? Authorizing Provider  Ascorbic Acid (VITAMIN C) 500 MG CHEW Chew 500 mg by mouth daily.   Yes [provider]  fluconazole (DIFLUCAN) 150 MG tablet Take 1 tablet (150 mg total) by mouth once a week for 4 doses. 12/13/22 01/04/23 Yes Mirna Mires, CNM  glycopyrrolate (ROBINUL) 1 MG tablet TAKE 1 TABLET (1 MG TOTAL) BY MOUTH DAILY. EXCESSIVE SWEATING 05/07/22  Yes [name], NP  Multiple Vitamin (MULTIVITAMIN WITH MINERALS) TABS tablet Take 1 tablet by mouth daily.   Yes [provider]  valACYclovir (VALTREX) 500 MG tablet Take 1 tablet (500 mg total) by mouth daily. 12/13/22   Mirna Mires, CNM  Burr Medico 150-35 MCG/24HR transdermal patch Place 1 patch onto the skin once a week. 12/13/22   Mirna Mires, CNM    Allergies  Allergen Reactions   Penicillins Hives    Gynecologic History: Patient's last menstrual period was 11/29/2022. 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    Enjoys spending time with friends.   Will be starting a job with [employer] in August 2019   Social Determinants of Health   Financial  Resource Strain: Not on file  Food Insecurity: Not on file  Transportation Needs: Not on file  Physical Activity: Not on file  Stress: Not on file  Social Connections: Not on file  Intimate Partner Violence: Not on file    Family History  Problem Relation Age of Onset   Arthritis Maternal Grandmother    Stroke Maternal Grandmother    Hypertension Maternal Grandmother    Diabetes Maternal Grandmother    Hypercholesterolemia Maternal Grandmother    Hypertension Maternal Grandfather    Hypercholesterolemia Maternal Grandfather    Other Mother        acid reflux    Review of Systems  Constitutional: Negative.   HENT: Negative.    Eyes: Negative.   Respiratory: Negative.    Cardiovascular: Negative.   Gastrointestinal: Negative.   Genitourinary: Negative.   Musculoskeletal: Negative.   Skin: Negative.   All other systems reviewed and are negative.    Physical Exam BP 100/67   Pulse 91   Ht 5' (1.524 m)   Wt 87 lb (39.5 kg)   LMP 11/29/2022   BMI 16.99 kg/m    Physical Exam Constitutional:      Appearance: Normal appearance.     Comments: Low BMI of 16. Underweight.  Genitourinary:     Vulva and rectum normal.     Genitourinary Comments: No external lesions or irritation. Speculum exam: moderate white discharge noted. , cervix is closed. Uterus is anteverted, non enlarged, no CMT. No adnexal pain or enlargement.  HENT:     Head: Normocephalic and atraumatic.  Eyes:     Extraocular Movements: Extraocular movements intact.     Pupils: Pupils are equal, round, and reactive to light.  Cardiovascular:     Rate and Rhythm: Normal rate and regular rhythm.     Pulses: Normal pulses.     Heart sounds: Normal heart sounds.  Pulmonary:     Effort: Pulmonary effort is normal.     Breath sounds: Normal breath sounds.  Chest:     Chest wall: Mass present.     Comments: Palpable left breast mass- hared, located approximately 4 cms from  the nipple in the upper outer  quadrant of the left breast- nodule is 1.5 cms across, mobile and hard. Abdominal:     General: Abdomen is flat.     Palpations: Abdomen is soft.  Musculoskeletal:        General: Normal range of motion.     Cervical back: Normal range of motion and neck supple.  Neurological:     General: No focal deficit present.     Mental Status: She is alert and oriented to person, place, and  time.  Skin:    General: Skin is warm and dry.  Psychiatric:        Mood and Affect: Mood normal.        Behavior: Behavior normal.     Female chaperone present for pelvic and breast  portions of the physical exam  Results:  PHQ-9: 9 GADis 11   Assessment: 22 y.o. G73P0010 female here for routine annual gynecologic examination HSV suppression Low BMI Left breast mass  Plan: Problem List Items Addressed This Visit       Genitourinary   Herpes simplex vulvovaginitis   Relevant Medications   fluconazole (DIFLUCAN) 150 MG tablet   valACYclovir (VALTREX) 500 MG tablet   Other Visit Diagnoses     Yeast vaginitis    -  Primary   Relevant Medications   fluconazole (DIFLUCAN) 150 MG tablet   valACYclovir (VALTREX) 500 MG tablet   Encounter for annual routine gynecological examination       Relevant Medications   fluconazole (DIFLUCAN) 150 MG tablet   Other Relevant Orders   Cervicovaginal ancillary only   Cytology - PAP   Screen for STD (sexually transmitted disease)       Relevant Orders   Cervicovaginal ancillary only   Cervical cancer screening       Relevant Orders   Cytology - PAP   Encounter for refill of prescription for contraception       Relevant Medications   XULANE 150-35 MCG/24HR transdermal patch       Screening: -- Blood pressure screen normal -- Weight screening:  chronically underweight- She is aware and this has been addressed repeatedly. She eats one meal daily- is Lactose intolerant. Likes meat, chicken. Suggestions made today -- Depression screening negative  (PHQ-9) -- Nutrition: normal -- cholesterol screening: not due for screening -- osteoporosis screening: not due -- tobacco screening:  discussed her THC use. -- alcohol screening: AUDIT questionnaire indicates low-risk usage. -- family history of breast cancer screening: done. not at high risk. -- no evidence of domestic violence or intimate partner violence. -- STD screening: gonorrhea/chlamydia NAAT collected -- pap smear collected per ASCCP guidelines -- flu vaccine  per her PCP -- HPV vaccination series:  she is uncertain whether she has had the series.  I have ordered a diagnostic mammo and ultrasound iof the left breast to look at the nodule she has in the upper outer quadrant. She ahs had this for years, but is has never been viewed.  Her patches are renewed. Diflucan provided via RX for yeast. Valtrex renewed for her suppressive therapy for HSV.  RTC 1 year.  Mirna Mires, CNM  12/13/2022 12:20 PM   Mirna Mires, CNM  12/13/2022 9:53 AM   12/13/2022 9:28 AM

## 2022-12-13 NOTE — Assessment & Plan Note (Signed)
Noted at her annual Gyn physical exam on 12/13/2022. Order for diagnostic mammo and ultrasound ordered.

## 2022-12-14 LAB — CERVICOVAGINAL ANCILLARY ONLY
Bacterial Vaginitis (gardnerella): NEGATIVE
Candida Glabrata: NEGATIVE
Candida Vaginitis: POSITIVE — AB
Chlamydia: NEGATIVE
Comment: NEGATIVE
Comment: NEGATIVE
Comment: NEGATIVE
Comment: NEGATIVE
Comment: NEGATIVE
Comment: NORMAL
Neisseria Gonorrhea: NEGATIVE
Trichomonas: NEGATIVE

## 2022-12-14 LAB — CYTOLOGY - PAP
Adequacy: ABSENT
Diagnosis: NEGATIVE

## 2022-12-14 NOTE — Telephone Encounter (Signed)
Left msg that issue has been fixed; she can schedule her appt now.

## 2022-12-15 ENCOUNTER — Ambulatory Visit
Admission: RE | Admit: 2022-12-15 | Discharge: 2022-12-15 | Disposition: A | Discharge status: Home / Self Care | Payer: BC Managed Care – PPO | Source: Ambulatory Visit | Attending: Obstetrics | Admitting: Obstetrics

## 2022-12-15 DIAGNOSIS — N6311 Unspecified lump in the right breast, upper outer quadrant: Secondary | ICD-10-CM | POA: Insufficient documentation

## 2022-12-15 DIAGNOSIS — Z01419 Encounter for gynecological examination (general) (routine) without abnormal findings: Secondary | ICD-10-CM | POA: Diagnosis present

## 2022-12-18 ENCOUNTER — Encounter: Payer: Self-pay | Admitting: Obstetrics

## 2022-12-18 ENCOUNTER — Other Ambulatory Visit: Payer: Self-pay | Admitting: Obstetrics

## 2022-12-18 NOTE — Group Note (Deleted)

## 2022-12-18 NOTE — Progress Notes (Signed)
Aptima test confirmed suspected yeast vaginitis/  Mirna Mires, CNM  12/18/2022 8:49 AM

## 2023-01-10 ENCOUNTER — Other Ambulatory Visit: Payer: Self-pay | Admitting: Primary Care

## 2023-01-10 DIAGNOSIS — L74510 Primary focal hyperhidrosis, axilla: Secondary | ICD-10-CM

## 2023-01-10 MED ORDER — GLYCOPYRROLATE 1 MG PO TABS
1.0000 mg | ORAL_TABLET | Freq: Every day | ORAL | 0 refills | Status: DC
Start: 2023-01-10 — End: 2023-07-13

## 2023-04-27 ENCOUNTER — Encounter: Payer: Self-pay | Admitting: Primary Care

## 2023-04-27 ENCOUNTER — Ambulatory Visit (INDEPENDENT_AMBULATORY_CARE_PROVIDER_SITE_OTHER): Payer: BC Managed Care – PPO | Admitting: Primary Care

## 2023-04-27 VITALS — BP 102/62 | HR 106 | Temp 98.6°F | Ht 60.0 in | Wt 87.0 lb

## 2023-04-27 DIAGNOSIS — R5383 Other fatigue: Secondary | ICD-10-CM | POA: Diagnosis not present

## 2023-04-27 DIAGNOSIS — H9190 Unspecified hearing loss, unspecified ear: Secondary | ICD-10-CM | POA: Insufficient documentation

## 2023-04-27 DIAGNOSIS — H9193 Unspecified hearing loss, bilateral: Secondary | ICD-10-CM | POA: Diagnosis not present

## 2023-04-27 LAB — IBC + FERRITIN
Ferritin: 30.9 ng/mL (ref 10.0–291.0)
Iron: 193 ug/dL — ABNORMAL HIGH (ref 42–145)
Saturation Ratios: 39.4 % (ref 20.0–50.0)
TIBC: 490 ug/dL — ABNORMAL HIGH (ref 250.0–450.0)
Transferrin: 350 mg/dL (ref 212.0–360.0)

## 2023-04-27 LAB — CBC
HCT: 38.2 % (ref 36.0–46.0)
Hemoglobin: 12.5 g/dL (ref 12.0–15.0)
MCHC: 32.8 g/dL (ref 30.0–36.0)
MCV: 93.9 fL (ref 78.0–100.0)
Platelets: 270 10*3/uL (ref 150.0–400.0)
RBC: 4.06 Mil/uL (ref 3.87–5.11)
RDW: 13 % (ref 11.5–15.5)
WBC: 5 10*3/uL (ref 4.0–10.5)

## 2023-04-27 LAB — BASIC METABOLIC PANEL
BUN: 10 mg/dL (ref 6–23)
CO2: 27 meq/L (ref 19–32)
Calcium: 9.3 mg/dL (ref 8.4–10.5)
Chloride: 102 meq/L (ref 96–112)
Creatinine, Ser: 0.65 mg/dL (ref 0.40–1.20)
GFR: 123.08 mL/min (ref >60.00)
Glucose, Bld: 81 mg/dL (ref 70–99)
Potassium: 4.1 meq/L (ref 3.5–5.1)
Sodium: 136 meq/L (ref 135–145)

## 2023-04-27 LAB — VITAMIN D 25 HYDROXY (VIT D DEFICIENCY, FRACTURES): VITD: 34.85 ng/mL (ref 30.00–100.00)

## 2023-04-27 LAB — VITAMIN B12: Vitamin B-12: 483 pg/mL (ref 211–911)

## 2023-04-27 NOTE — Assessment & Plan Note (Addendum)
Differentials include excessive marijuana use, iron deficiency anemia, vitamin deficiency. Reviewed prior thyroid studies which have been normal.  Checking labs today including iron studies, BMP, vitamin D, vitamin B12.  Recommended she quit smoking marijuana.

## 2023-04-27 NOTE — Patient Instructions (Addendum)
Stop by the lab prior to leaving today. I will notify you of your results once received.   It was a pleasure to see you today!  

## 2023-04-27 NOTE — Progress Notes (Signed)
Subjective:    Patient ID: Krista Flores, female    DOB: [birth year], 23 y.o.   MRN: [id number]  HPI  Krista Flores is a very pleasant 23 y.o. female with a history of GAD, underweight, decrease in appetite, frequent headaches, fatigue, iron deficiency anemia who presents today to discuss several concerns.  1) Fatigue: History of iron deficiency anemia.    Over the last few weeks she's felt more sluggish and tired, is napping three times daily. She recently graduated and is not working right now. She feels like she did when her iron levels were low last year.   She has a history of menorrhagia which improved 1 year ago. She is currently managed on the Xulane birth control patch which has helped significantly.   Ferritin of 8.0 in July 2024 with normal CBC. She does not take oral iron, but her multivitamin contains some iron. She has increased her intake of red meat over the last few months.   She denies rectal bleeding.  She does smoke marijuana daily and regularly, plans on quitting this year.  Wt Readings from Last 3 Encounters:  04/27/23 87 lb (39.5 kg)  12/13/22 87 lb (39.5 kg)  10/25/22 87 lb (39.5 kg)    2) Hearing Changes: Over the last few weeks her family has been telling her that she's yelling when talking. She does notice a decrease in hearing sometimes, but hears well for the most part.  She does not feel as though she was yelling.  She has never been told that she turns the TV up too high.  She denies ear fullness, pain.   Review of Systems  Constitutional:  Positive for fatigue.  HENT:  Positive for hearing loss.   Respiratory:  Negative for shortness of breath.   Cardiovascular:  Negative for palpitations.  Gastrointestinal:  Negative for blood in stool.  Genitourinary:  Negative for menstrual problem.         Past Medical History:  Diagnosis Date   Anxiety    BV (bacterial vaginosis)    Chest pain, exertional 05/11/2015   Chlamydia 2017-2020   x4    Chlamydia infection 08/15/2016   Positive Chlamydia on 08/08/2016 and 10/19/2016   Chronic cough 06/23/2020   Frequent headaches    Genital herpes 02/2020   type 2 on culture   Midline thoracic back pain 10/29/2014   Polydipsia 06/25/2019   UTI (lower urinary tract infection)    Viral gastroenteritis 02/01/2021   Viral URI 04/18/2022    Social History   Socioeconomic History   Marital status: Single    Spouse name: Not on file   Number of children: 0   Years of education: 12   Highest education level: Not on file  Occupational History   Occupation: Conservation officer, 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    Enjoys spending time with friends.   Will be starting a job with [employer] in August 2019   Social Drivers of Corporate investment banker Strain: Not on [employer] Insecurity: Not on file  Transportation Needs: Not on file  Physical Activity: Not on file  Stress: Not on file  Social Connections: Not on file  Intimate Partner Violence: Not on file    Past Surgical History:  Procedure Laterality Date   WISDOM TOOTH EXTRACTION      Family History  Problem Relation Age of Onset   Arthritis Maternal Grandmother    Stroke Maternal Grandmother    Hypertension Maternal Grandmother    Diabetes Maternal Grandmother    Hypercholesterolemia Maternal Grandmother    Hypertension Maternal Grandfather    Hypercholesterolemia Maternal Grandfather    Other  Mother        acid reflux    Allergies  Allergen Reactions   Penicillins Hives    Current Outpatient Medications on File Prior to Visit  Medication Sig Dispense Refill   Ascorbic Acid (VITAMIN C) 500 MG CHEW Chew 500 mg by mouth daily.     glycopyrrolate (ROBINUL) 1 MG tablet Take 1 tablet (1 mg total) by mouth daily. Excessive sweating 90 tablet 0   Multiple Vitamin (MULTIVITAMIN WITH MINERALS) TABS tablet Take 1 tablet by mouth daily.     valACYclovir (VALTREX) 500 MG tablet Take 1 tablet (500 mg total) by mouth daily. 90 tablet 3   XULANE 150-35 MCG/24HR transdermal patch Place 1 patch onto the skin once a week. 9 patch 4   No current facility-administered medications on file prior to visit.    BP 102/62   Pulse (!) 106   Temp 98.6 F (37 C) (Temporal)   Ht 5' (1.524 m)   Wt 87 lb (39.5 kg)   SpO2 98%   BMI 16.99 kg/m  Objective:   Physical Exam HENT:     Right Ear: Tympanic membrane and ear canal normal. There is no impacted cerumen.     Left Ear: Tympanic membrane and ear canal normal. There is no impacted cerumen.  Cardiovascular:     Rate and Rhythm: Normal rate and regular rhythm.  Pulmonary:     Effort: Pulmonary effort is normal.     Breath sounds: Normal breath sounds.  Musculoskeletal:     Cervical back: Neck supple.  Skin:    General: Skin is warm and dry.  Neurological:     Mental Status: She is alert and oriented to person, place, and time.  Psychiatric:        Mood and Affect: Mood normal.           Assessment & Plan:  Other fatigue Assessment & Plan: Differentials include excessive marijuana use, iron deficiency anemia, vitamin deficiency. Reviewed prior thyroid studies which have been normal.  Checking labs today including iron studies, BMP, vitamin D, vitamin B12.  Recommended she quit smoking marijuana.  Orders: -     CBC -     IBC + Ferritin -     Vitamin B12 -     VITAMIN D 25 Hydroxy (Vit-D Deficiency, Fractures) -     Basic  metabolic panel  Bilateral change in hearing Assessment & Plan: Hearing exam today benign. ENT exam today benign.  Offered referral for formal hearing testing for which she kindly declines. She will notify if she changes her mind.         Krista Flores  Krista Flores Spore, NP

## 2023-04-27 NOTE — Assessment & Plan Note (Signed)
Hearing exam today benign. ENT exam today benign.  Offered referral for formal hearing testing for which she kindly declines. She will notify if she changes her mind.

## 2023-05-29 ENCOUNTER — Other Ambulatory Visit: Payer: Self-pay | Admitting: Primary Care

## 2023-05-29 DIAGNOSIS — N63 Unspecified lump in unspecified breast: Secondary | ICD-10-CM

## 2023-05-29 DIAGNOSIS — R928 Other abnormal and inconclusive findings on diagnostic imaging of breast: Secondary | ICD-10-CM

## 2023-07-13 ENCOUNTER — Other Ambulatory Visit: Payer: Self-pay | Admitting: Primary Care

## 2023-07-13 DIAGNOSIS — L74510 Primary focal hyperhidrosis, axilla: Secondary | ICD-10-CM

## 2023-08-14 ENCOUNTER — Ambulatory Visit
Admission: RE | Admit: 2023-08-14 | Discharge: 2023-08-14 | Disposition: A | Discharge status: Home / Self Care | Source: Ambulatory Visit | Attending: Primary Care | Admitting: Primary Care

## 2023-08-14 DIAGNOSIS — R928 Other abnormal and inconclusive findings on diagnostic imaging of breast: Secondary | ICD-10-CM | POA: Insufficient documentation

## 2023-08-14 DIAGNOSIS — N63 Unspecified lump in unspecified breast: Secondary | ICD-10-CM | POA: Insufficient documentation

## 2023-10-08 ENCOUNTER — Other Ambulatory Visit: Payer: Self-pay | Admitting: Primary Care

## 2023-10-08 DIAGNOSIS — L74510 Primary focal hyperhidrosis, axilla: Secondary | ICD-10-CM

## 2023-12-13 NOTE — Progress Notes (Signed)
 PCP:  Gretta Comer POUR, NP   Chief Complaint  Patient presents with   Gynecologic Exam    No concerns     HPI:      [name] is a 23 y.o. G1P0010 whose LMP was Patient's last menstrual period was 11/27/2023 (exact date)., presents today for her annual examination.  Her menses are regular every 28-30 days, lasting 5 days on xulane , mod flow, no BTB, mild dysmen.     Sex activity: single partner, contraception - Ortho-Evra patches weekly. Does have pain with penetration with tearing at post fourchette; doesn't use lubricants. No bleeding/dryness.  Last Pap: 12/13/22  Results were: no abnormalities  Hx of STDs: HSV, takes valtrex  daily  There is no FH of breast cancer. There is no FH of ovarian cancer. The patient does occas do self-breast exams. 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 Being followed with Q6 mo u/s. No change in size per pt  Tobacco use: vapes daily Alcohol use: none Daily MJ use.  Exercise: moderately active  She does not get adequate calcium and Vitamin D  in her diet.  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   History of chlamydia 11/09/2018   GAD (generalized anxiety disorder) 10/24/2018   Contraception management 08/08/2016    Past Surgical History:  Procedure Laterality Date   WISDOM TOOTH EXTRACTION      Family History  Problem Relation Age of Onset   Arthritis Maternal Grandmother    Stroke Maternal Grandmother    Hypertension Maternal Grandmother    Diabetes Maternal Grandmother    Hypercholesterolemia Maternal Grandmother    Hypertension  Maternal Grandfather    Hypercholesterolemia Maternal Grandfather    Other Mother        acid reflux    Social History   Socioeconomic History   Marital status: Single    Spouse name: Not on file   Number of children: 0   Years of education: 12   Highest education level: Not on file  Occupational History   Occupation: Conservation officer, nature    Comment: Graduated from [place] 2018   Occupation: student    Comment: [employer]  Tobacco Use   Smoking status: Some Days    Types: E-cigarettes   Smokeless tobacco: Never  Vaping Use   Vaping status: Every Day  Substance and Sexual Activity   Alcohol use: No    Alcohol/week: 0.0 standard drinks of alcohol   Drug use: Yes    Types: Marijuana    Comment: heavy use   Sexual activity: Yes    Partners: Male    Birth control/protection: Patch  Other Topics Concern   Not on file  Social History Narrative   Student at [employer]   She would like to a Engineer, mining.    Enjoys spending time with friends.   Will be starting a job with [employer] in August 2019   Social Drivers of Corporate investment banker Strain: Not on [employer] Insecurity: Not on file  Transportation Needs: Not on file  Physical Activity: Not on file  Stress: Not on file  Social Connections: Not on file  Intimate Partner Violence: Not on file     Current Outpatient Medications:    glycopyrrolate  (ROBINUL ) 1 MG tablet, TAKE 1 TABLET (1 MG TOTAL) BY MOUTH DAILY. EXCESSIVE SWEATING, Disp: 90 tablet, Rfl: 0   Multiple Vitamin (MULTIVITAMIN WITH MINERALS) TABS tablet, Take 1 tablet by mouth daily., Disp: , Rfl:    Ascorbic Acid (VITAMIN C) 500 MG CHEW, Chew 500 mg by mouth daily. (Patient not taking: Reported on 12/14/2023), Disp: , Rfl:    valACYclovir  (VALTREX ) 500 MG tablet, Take 1 tablet (500 mg total) by mouth daily., Disp: 90 tablet, Rfl: 3   XULANE  150-35 MCG/24HR transdermal patch, Place 1 patch onto the skin once a week., Disp: 9 patch, Rfl: 3     ROS:  Review of Systems   Constitutional:  Negative for fatigue, fever and unexpected weight change.  Respiratory:  Negative for cough, shortness of breath and wheezing.   Cardiovascular:  Negative for chest pain, palpitations and leg swelling.  Gastrointestinal:  Negative for blood in stool, constipation, diarrhea, nausea and vomiting.  Endocrine: Negative for cold intolerance, heat intolerance and polyuria.  Genitourinary:  Negative for dyspareunia, dysuria, flank pain, frequency, genital sores, hematuria, menstrual problem, pelvic pain, urgency, vaginal bleeding, vaginal discharge and vaginal pain.  Musculoskeletal:  Negative for back pain, joint swelling and myalgias.  Skin:  Negative for rash.  Neurological:  Negative for dizziness, syncope, light-headedness, numbness and headaches.  Hematological:  Negative for adenopathy.  Psychiatric/Behavioral:  Positive for agitation. Negative for confusion, sleep disturbance and suicidal ideas. The patient is not nervous/anxious.    BREAST: No symptoms   Objective: BP 103/71   Pulse (!) 102   Ht 5' (1.524 m)   Wt 89 lb (40.4 kg)   LMP 11/27/2023 (Exact Date)   BMI 17.38 kg/m    Physical Exam Constitutional:      Appearance: She is well-developed.  Genitourinary:     Vulva normal.     Right Labia: No rash, tenderness or lesions.    Left Labia: No tenderness, lesions or rash.       No vaginal discharge, erythema or tenderness.      Right Adnexa: not tender and no mass present.    Left Adnexa: not tender and no mass present.    No cervical friability or polyp.     Uterus is not enlarged or tender.  Breasts:    Right: No mass, nipple discharge, skin change or tenderness.     Left: Mass present. No nipple discharge, skin change or tenderness.  Neck:     Thyroid : No thyromegaly.  Cardiovascular:     Rate and Rhythm: Normal rate and regular rhythm.     Heart sounds: Normal heart sounds. No murmur heard. Pulmonary:     Effort: Pulmonary effort is normal.      Breath sounds: Normal breath sounds.  Abdominal:     Palpations: Abdomen is soft.     Tenderness: There is no abdominal tenderness. There is no guarding or rebound.  Musculoskeletal:        General: Normal range of motion.     Cervical back: Normal range of motion.  Lymphadenopathy:     Cervical: No cervical adenopathy.  Neurological:     General: No focal deficit present.     Mental Status: She is alert and oriented to person, place, and time.     Cranial Nerves: No cranial nerve deficit.  Skin:    General: Skin is warm and dry.  Psychiatric:  Mood and Affect: Mood normal.        Behavior: Behavior normal.        Thought Content: Thought content normal.        Judgment: Judgment normal.  Vitals reviewed.     Assessment/Plan: Encounter for annual routine gynecological examination  Screening for STD (sexually transmitted disease) - Plan: Cervicovaginal ancillary only  Encounter for surveillance of transdermal patch hormonal contraceptive device - Plan: XULANE  150-35 MCG/24HR transdermal patch; Rx RF eRxd.   Herpes simplex vulvovaginitis - Plan: valACYclovir  (VALTREX ) 500 MG tablet; Rx RF eRxd.   Dyspareunia in female--with tear at post fourchette. Abstinence for 2 wks to heal, then use lubricants. F/u prn.   Mass of upper outer quadrant of left breast--stable on breast u/s, has 6 mo f/u due 11/25   Meds ordered this encounter  Medications   valACYclovir  (VALTREX ) 500 MG tablet    Sig: Take 1 tablet (500 mg total) by mouth daily.    Dispense:  90 tablet    Refill:  3    Supervising Provider:   ROBY, MICIA [[id number]]   XULANE  150-35 MCG/24HR transdermal patch    Sig: Place 1 patch onto the skin once a week.    Dispense:  9 patch    Refill:  3    Supervising Provider:   ROBY, MICIA [[id number]]             GYN counsel adequate intake of calcium and vitamin D , diet and exercise, d/c vaping and MJ use     F/U  Return in about 1 year (around  12/16/2024).  Marnesha Gagen B. Aubriegh Minch, PA-C 12/17/2023 8:57 AM

## 2023-12-14 ENCOUNTER — Ambulatory Visit (INDEPENDENT_AMBULATORY_CARE_PROVIDER_SITE_OTHER): Admitting: Primary Care

## 2023-12-14 ENCOUNTER — Encounter: Payer: Self-pay | Admitting: Primary Care

## 2023-12-14 VITALS — BP 122/84 | HR 88 | Temp 97.9°F | Ht 60.0 in | Wt 87.0 lb

## 2023-12-14 DIAGNOSIS — Z0000 Encounter for general adult medical examination without abnormal findings: Secondary | ICD-10-CM

## 2023-12-14 DIAGNOSIS — R519 Headache, unspecified: Secondary | ICD-10-CM

## 2023-12-14 DIAGNOSIS — R636 Underweight: Secondary | ICD-10-CM

## 2023-12-14 DIAGNOSIS — A6004 Herpesviral vulvovaginitis: Secondary | ICD-10-CM | POA: Diagnosis not present

## 2023-12-14 DIAGNOSIS — R61 Generalized hyperhidrosis: Secondary | ICD-10-CM

## 2023-12-14 DIAGNOSIS — F411 Generalized anxiety disorder: Secondary | ICD-10-CM

## 2023-12-14 DIAGNOSIS — Z831 Family history of other infectious and parasitic diseases: Secondary | ICD-10-CM

## 2023-12-14 DIAGNOSIS — Z3045 Encounter for surveillance of transdermal patch hormonal contraceptive device: Secondary | ICD-10-CM

## 2023-12-14 LAB — CBC
HCT: 35.2 % — ABNORMAL LOW (ref 36.0–46.0)
Hemoglobin: 11.8 g/dL — ABNORMAL LOW (ref 12.0–15.0)
MCHC: 33.5 g/dL (ref 30.0–36.0)
MCV: 91.7 fl (ref 78.0–100.0)
Platelets: 246 K/uL (ref 150.0–400.0)
RBC: 3.84 Mil/uL — ABNORMAL LOW (ref 3.87–5.11)
RDW: 13.7 % (ref 11.5–15.5)
WBC: 3.5 K/uL — ABNORMAL LOW (ref 4.0–10.5)

## 2023-12-14 LAB — IBC + FERRITIN
Ferritin: 11.4 ng/mL (ref 10.0–291.0)
Iron: 38 ug/dL — ABNORMAL LOW (ref 42–145)
Saturation Ratios: 8.4 % — ABNORMAL LOW (ref 20.0–50.0)
TIBC: 453.6 ug/dL — ABNORMAL HIGH (ref 250.0–450.0)
Transferrin: 324 mg/dL (ref 212.0–360.0)

## 2023-12-14 LAB — COMPREHENSIVE METABOLIC PANEL WITH GFR
ALT: 7 U/L (ref 0–35)
AST: 15 U/L (ref 0–37)
Albumin: 4 g/dL (ref 3.5–5.2)
Alkaline Phosphatase: 44 U/L (ref 39–117)
BUN: 7 mg/dL (ref 6–23)
CO2: 26 meq/L (ref 19–32)
Calcium: 8.8 mg/dL (ref 8.4–10.5)
Chloride: 104 meq/L (ref 96–112)
Creatinine, Ser: 0.68 mg/dL (ref 0.40–1.20)
GFR: 121.21 mL/min (ref >60.00)
Glucose, Bld: 89 mg/dL (ref 70–99)
Potassium: 4 meq/L (ref 3.5–5.1)
Sodium: 138 meq/L (ref 135–145)
Total Bilirubin: 0.5 mg/dL (ref 0.2–1.2)
Total Protein: 7.1 g/dL (ref 6.0–8.3)

## 2023-12-14 NOTE — Patient Instructions (Addendum)
 Stop by the lab prior to leaving today. I will notify you of your results once received.   You will either be contacted via phone regarding your referral to the nutritionist, or you may receive a letter on your MyChart portal from our referral team with instructions for scheduling an appointment. Please let us  know if you have not been contacted by anyone within two weeks.  It was a pleasure to see you today! Will

## 2023-12-14 NOTE — Assessment & Plan Note (Signed)
 Weight has been stable but still no weight gain.  Recommended nutritionist referral, she agrees. Referral placed.

## 2023-12-14 NOTE — Assessment & Plan Note (Signed)
 No recent outbreaks. Continue Valtrex 500 mg daily per GYN

## 2023-12-14 NOTE — Progress Notes (Signed)
 Subjective:    Patient ID: Krista Flores, female    DOB: 2000-05-03, 23 y.o.   MRN: [id number]  Krista Flores is a very pleasant 23 y.o. female who presents today for complete physical and follow up of chronic conditions.  She would also like to be tested for hepatis C. Her mother was recently diagnosed with hepatitis C and is undergoing treatment.  She tested negative for hepatitis C, HIV, and other STD in 2022.  Immunizations: -Tetanus: Completed in 2021 -Influenza: Declines influenza vaccine.  -HPV: Completed series  Diet: Fair diet.  Exercise: Regular exercise.  Eye exam: Completes annually  Dental exam: Completes semi-annually    Pap Smear: Completed in 2024  BP Readings from Last 3 Encounters:  12/14/23 122/84  04/27/23 102/62  12/13/22 100/67   Wt Readings from Last 3 Encounters:  12/14/23 87 lb (39.5 kg)  04/27/23 87 lb (39.5 kg)  12/13/22 87 lb (39.5 kg)       Review of Systems  Constitutional:  Negative for unexpected weight change.  HENT:  Negative for rhinorrhea.   Respiratory:  Negative for cough and shortness of breath.   Cardiovascular:  Negative for chest pain.  Gastrointestinal:  Negative for constipation and diarrhea.  Genitourinary:  Negative for difficulty urinating and menstrual problem.  Musculoskeletal:  Negative for arthralgias and myalgias.  Skin:  Negative for rash.  Allergic/Immunologic: Negative for environmental allergies.  Neurological:  Negative for dizziness, numbness and headaches.  Psychiatric/Behavioral:  The patient is not nervous/anxious.          Past Medical History:  Diagnosis Date   Anxiety    BV (bacterial vaginosis)    Chest pain, exertional 05/11/2015   Chlamydia 2017-2020   x4   Chlamydia infection 08/15/2016   Positive Chlamydia on 08/08/2016 and 10/19/2016   Chronic cough 06/23/2020   Frequent headaches    Genital herpes 02/2020   type 2 on culture   Mass of upper outer quadrant of left breast  12/13/2022   Midline thoracic back pain 10/29/2014   Polydipsia 06/25/2019   UTI (lower urinary tract infection)    Viral gastroenteritis 02/01/2021   Viral URI 04/18/2022    Social History   Socioeconomic History   Marital status: Single    Spouse name: Not on file   Number of children: 0   Years of education: 12   Highest education level: Not on file  Occupational History   Occupation: Conservation officer, 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    Enjoys spending time with friends.   Will be starting a job with [employer] in August 2019   Social Drivers of Corporate investment banker Strain: Not on [employer] Insecurity: Not on file  Transportation Needs: Not on file  Physical Activity: Not on file  Stress: Not  on file  Social Connections: Not on file  Intimate Partner Violence: Not on file    Past Surgical History:  Procedure Laterality Date   WISDOM TOOTH EXTRACTION      Family History  Problem Relation Age of Onset   Arthritis Maternal Grandmother    Stroke Maternal Grandmother    Hypertension Maternal Grandmother    Diabetes Maternal Grandmother    Hypercholesterolemia Maternal Grandmother    Hypertension Maternal Grandfather    Hypercholesterolemia Maternal Grandfather    Other Mother        acid reflux    Allergies  Allergen Reactions   Penicillins Hives    Current Outpatient Medications on File Prior to Visit  Medication Sig Dispense  Refill   glycopyrrolate  (ROBINUL ) 1 MG tablet TAKE 1 TABLET (1 MG TOTAL) BY MOUTH DAILY. EXCESSIVE SWEATING 90 tablet 0   Multiple Vitamin (MULTIVITAMIN WITH MINERALS) TABS tablet Take 1 tablet by mouth daily.     valACYclovir  (VALTREX ) 500 MG tablet Take 1 tablet (500 mg total) by mouth daily. 90 tablet 3   XULANE  150-35 MCG/24HR transdermal patch Place 1 patch onto the skin once a week. 9 patch 4   Ascorbic Acid (VITAMIN C) 500 MG CHEW Chew 500 mg by mouth daily. (Patient not taking: Reported on 12/14/2023)     No current facility-administered medications on file prior to visit.    BP 122/84   Pulse 88   Temp 97.9 F (36.6 C) (Temporal)   Ht 5' (1.524 m)   Wt 87 lb (39.5 kg)   LMP 11/27/2023   SpO2 97%   BMI 16.99 kg/m  Objective:   Physical Exam Constitutional:      Appearance: She is underweight.  HENT:     Right Ear: Tympanic membrane and ear canal normal.     Left Ear: Tympanic membrane and ear canal normal.  Eyes:     Pupils: Pupils are equal, round, and reactive to light.  Cardiovascular:     Rate and Rhythm: Normal rate and regular rhythm.  Pulmonary:     Effort: Pulmonary effort is normal.     Breath sounds: Normal breath sounds.  Abdominal:     General: Bowel sounds are normal.     Palpations: Abdomen is soft.     Tenderness: There is no abdominal tenderness.  Musculoskeletal:        General: Normal range of motion.     Cervical back: Neck supple.  Skin:    General: Skin is warm and dry.  Neurological:     Mental Status: She is alert and oriented to person, place, and time.     Cranial Nerves: No cranial nerve deficit.     Deep Tendon Reflexes:     Reflex Scores:      Patellar reflexes are 2+ on the right side and 2+ on the left side. Psychiatric:        Mood and Affect: Mood normal.     Physical Exam        Assessment & Plan:  Preventative health care Assessment & Plan: Immunizations UTD. Declines influenza vaccine.  Pap smear  UTD.  Discussed the importance of a healthy diet and regular exercise in order for weight loss, and to reduce the risk of further co-morbidity.  Exam stable. Labs pending.  Follow up in 1 year for repeat physical.    Hyperhidrosis Assessment & Plan: Stable.  Continue glycopyrrolate  1 mg daily.   Herpes simplex vulvovaginitis Assessment & Plan: No recent  outbreaks.  Continue Valtrex  500 mg daily per GYN.   Underweight due to inadequate caloric intake Assessment & Plan: Weight has been stable but still no weight gain.  Recommended nutritionist referral, she agrees. Referral placed.   Orders: -     IBC + Ferritin -     Comprehensive metabolic panel with GFR -     CBC -     Amb Ref to Medical Weight Management  GAD (generalized anxiety disorder) Assessment & Plan: Stable per patient.  No concerns today.   Frequent headaches Assessment & Plan: Controlled.  No concerns today.   Encounter for surveillance of transdermal patch hormonal contraceptive device Assessment & Plan: Pap smear UTD.  Continue Xulane  patches.    Family history of hepatitis C -     Hepatitis C antibody -     Hepatitis B surface antibody,quantitative    Assessment and Plan Assessment & Plan         Comer MARLA Gaskins, NP      History of Present Illness

## 2023-12-14 NOTE — Assessment & Plan Note (Signed)
 Stable.  Continue glycopyrrolate  1 mg daily.

## 2023-12-14 NOTE — Assessment & Plan Note (Signed)
 Stable per patient. No concerns today.

## 2023-12-14 NOTE — Assessment & Plan Note (Signed)
Immunizations UTD. Declines influenza vaccine. Pap smear UTD  Discussed the importance of a healthy diet and regular exercise in order for weight loss, and to reduce the risk of further co-morbidity.  Exam stable. Labs pending.  Follow up in 1 year for repeat physical.  

## 2023-12-14 NOTE — Assessment & Plan Note (Signed)
 Controlled.  No concerns today.

## 2023-12-14 NOTE — Assessment & Plan Note (Signed)
 Pap smear UTD.  Continue Xulane  patches.

## 2023-12-16 ENCOUNTER — Ambulatory Visit: Payer: Self-pay | Admitting: Primary Care

## 2023-12-16 DIAGNOSIS — Z831 Family history of other infectious and parasitic diseases: Secondary | ICD-10-CM

## 2023-12-17 ENCOUNTER — Encounter: Payer: Self-pay | Admitting: Obstetrics and Gynecology

## 2023-12-17 ENCOUNTER — Ambulatory Visit (INDEPENDENT_AMBULATORY_CARE_PROVIDER_SITE_OTHER): Admitting: Obstetrics and Gynecology

## 2023-12-17 ENCOUNTER — Other Ambulatory Visit (HOSPITAL_COMMUNITY)
Admission: RE | Admit: 2023-12-17 | Discharge: 2023-12-17 | Disposition: A | Discharge status: Home / Self Care | Source: Ambulatory Visit | Attending: Obstetrics and Gynecology | Admitting: Obstetrics and Gynecology

## 2023-12-17 VITALS — BP 103/71 | HR 102 | Ht 60.0 in | Wt 89.0 lb

## 2023-12-17 DIAGNOSIS — N941 Unspecified dyspareunia: Secondary | ICD-10-CM

## 2023-12-17 DIAGNOSIS — Z113 Encounter for screening for infections with a predominantly sexual mode of transmission: Secondary | ICD-10-CM | POA: Insufficient documentation

## 2023-12-17 DIAGNOSIS — A6004 Herpesviral vulvovaginitis: Secondary | ICD-10-CM | POA: Diagnosis not present

## 2023-12-17 DIAGNOSIS — Z01419 Encounter for gynecological examination (general) (routine) without abnormal findings: Secondary | ICD-10-CM

## 2023-12-17 DIAGNOSIS — Z3045 Encounter for surveillance of transdermal patch hormonal contraceptive device: Secondary | ICD-10-CM

## 2023-12-17 DIAGNOSIS — N6321 Unspecified lump in the left breast, upper outer quadrant: Secondary | ICD-10-CM

## 2023-12-17 MED ORDER — VALACYCLOVIR HCL 500 MG PO TABS
500.0000 mg | ORAL_TABLET | Freq: Every day | ORAL | 3 refills | Status: AC
Start: 2023-12-17 — End: ?

## 2023-12-17 MED ORDER — XULANE 150-35 MCG/24HR TD PTWK: 1.0000 | MEDICATED_PATCH | TRANSDERMAL | 3 refills | Status: AC

## 2023-12-17 NOTE — Patient Instructions (Signed)
 I value your feedback and you entrusting [place] with your care. If you get a [place] patient survey, I would appreciate you taking the time to let us know about your experience today. Thank you! ? ? ?

## 2023-12-18 LAB — CERVICOVAGINAL ANCILLARY ONLY
Chlamydia: NEGATIVE
Comment: NEGATIVE
Comment: NORMAL
Neisseria Gonorrhea: NEGATIVE

## 2023-12-18 LAB — TEST AUTHORIZATION

## 2023-12-18 LAB — HEPATITIS C ANTIBODY: Hepatitis C Ab: NONREACTIVE

## 2023-12-18 LAB — HEPATITIS B SURFACE ANTIGEN: Hepatitis B Surface Ag: NONREACTIVE

## 2023-12-18 LAB — HEPATITIS B SURFACE ANTIBODY, QUANTITATIVE: Hep B S AB Quant (Post): <5 m[IU]/mL — ABNORMAL LOW (ref >=10)

## 2024-01-11 ENCOUNTER — Other Ambulatory Visit: Payer: Self-pay | Admitting: Primary Care

## 2024-01-11 DIAGNOSIS — L74510 Primary focal hyperhidrosis, axilla: Secondary | ICD-10-CM

## 2024-01-31 ENCOUNTER — Ambulatory Visit

## 2024-02-08 ENCOUNTER — Encounter: Payer: Self-pay | Admitting: Dietician

## 2024-02-08 ENCOUNTER — Encounter: Attending: Primary Care | Admitting: Dietician

## 2024-02-08 DIAGNOSIS — R636 Underweight: Secondary | ICD-10-CM

## 2024-02-08 NOTE — Progress Notes (Signed)
 Medical Nutrition Therapy  Appointment Start time:  [phone]  Appointment End time:  0955  Primary concerns today: Weight Gain  Referral diagnosis: R63.6 (ICD-10-CM) - Underweight due to inadequate caloric intake  Preferred learning style: No preference indicated Learning readiness:Ready, change in progress   NUTRITION ASSESSMENT   Anthropometrics Ht: 60 Wt: 86.8 lbs BMI: 17.0 kg/m2  Body Composition Scale Date 02/08/2024  Current Body Weight 86.8 lbs  Total Body Fat % 18.4  Muscle-Mass lbs 37.9  BMI 17.0         Torso lbs 31.8         Left Leg  lbs 10.41         Right Leg lbs 10.41         Left Arm lbs 3.06         Right Arm lbs 3.09     Clinical Medical Hx: Fatigue, GAD, Reflux, Lactose intolerance Medications: Glycopyrrolate  Labs (12/14/23): Iron - 38, Saturation ratios - 8.4, TIBC - 453.6, RBC - 3.84, Hemoglobin - 11.8, HCT - 35.2 Notable Signs/Symptoms: N/A   Lifestyle & Dietary Hx Pt reports frustration over inability to gain weight, states they have been stuck around 90 lbs. Pt reports good appetite, eats regularly throughout the day. Pt reports regular marijuana use, states they are concerned it may be interfering with weight gain, states they had previously gained weight when stopping use for a couple of months. Pt reports starting to exercise every other day first thing in the morning over the past couple of months, states they feel better overall since starting. Pt reports working from home, will snack during the day and at night sometimes.   Estimated daily fluid intake: >48 oz Supplements: [place] Sleep: Sleeps well Stress / self-care: Low Current average weekly physical activity: ADLs, lift weights 3-4 times a week, 30-60 minutes   24-Hr Dietary Recall First Meal: 4 mini pancakes, herbal tea Snack:  Second Meal: Stewed chicken, brown rice, kidney beans Snack: 4 mozzerella sticks, fries Third Meal:  Snack: 1/2 protein water, mini bag of pretzel  goldfish Beverages: water, mango punch, sleepy tea    NUTRITION DIAGNOSIS  [place]-3.1 Underweight As related to caloric intake.  As evidenced by BMI of 17.0 kg/m2, inability to achieve caloric surplus in relation to exercise frequency .   NUTRITION INTERVENTION  Nutrition education (E-1) on the following topics:  Educated patient on the role of increasing calories and protein on building muscle mass and healthy weight gain. Worked with patient to identify sources of protein and healthy fats, as well as how to increase calories when preparing food. Educated patient to eat 5-6 times a day to maximize protein intake. Encourage patient to incorporate physical activity, especially resistance training, as much as possible.   Handouts Provided Include  High Protein High Calorie Nutrition therapy  Eating Before Exercise Eating for Recovery  Learning Style & Readiness for Change Teaching method utilized: Visual & Auditory  Demonstrated degree of understanding via: Teach Back  Barriers to learning/adherence to lifestyle change: None  Goals Established by Pt Have an 8 oz glass of juice when you wake up before exercising. After your workout, have a high protein meal/drink within 30-45 minutes of finishing.  Eat a meal or snack every 3 hours that contains a good source of protein. Use your High Protein High Calorie Nutrition Therapy handout for high calorie/protein ideas! Have a spoonful of peanut butter as a snack whenever you want! Continue your exercise every other day!   MONITORING &  EVALUATION Dietary intake, weekly physical activity, and weight gain in 6 weeks.  Next Steps  Patient is to follow up with RD.

## 2024-02-08 NOTE — Patient Instructions (Addendum)
 Have an 8 oz glass of juice when you wake up before exercising. After your workout, have a high protein meal/drink within 30-45 minutes of finishing.   Eat a meal or snack every 3 hours that contains a good source of protein. Use your High Protein High Calorie Nutrition Therapy handout for high calorie/protein ideas!  Have a spoonful of peanut butter as a snack whenever you want!  Continue your exercise every other day!

## 2024-03-21 ENCOUNTER — Encounter: Admitting: Dietician

## 2024-04-16 ENCOUNTER — Other Ambulatory Visit: Payer: Self-pay | Admitting: Primary Care

## 2024-04-16 DIAGNOSIS — L74510 Primary focal hyperhidrosis, axilla: Secondary | ICD-10-CM

## 2024-04-18 ENCOUNTER — Encounter: Admitting: Dietician
